# Patient Record
Sex: Female | Born: 1985 | Race: White | Hispanic: No | Marital: Single | Smoking: Current some day smoker
Health system: Southern US, Community
[De-identification: ages and names within clinical notes are randomized; demographics above are authoritative.]

## PROBLEM LIST (undated history)

## (undated) DIAGNOSIS — F319 Bipolar disorder, unspecified: Secondary | ICD-10-CM

## (undated) DIAGNOSIS — F419 Anxiety disorder, unspecified: Secondary | ICD-10-CM

## (undated) HISTORY — DX: Bipolar disorder, unspecified: F31.9

## (undated) HISTORY — PX: WISDOM TOOTH EXTRACTION: SHX21

## (undated) HISTORY — DX: Anxiety disorder, unspecified: F41.9

## (undated) HISTORY — PX: MASTECTOMY: SHX3

---
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
## 2016-06-21 DIAGNOSIS — A749 Chlamydial infection, unspecified: Secondary | ICD-10-CM

## 2016-06-21 DIAGNOSIS — B1920 Unspecified viral hepatitis C without hepatic coma: Secondary | ICD-10-CM

## 2016-06-21 HISTORY — DX: Unspecified viral hepatitis C without hepatic coma: B19.20

## 2016-06-21 HISTORY — DX: Chlamydial infection, unspecified: A74.9

## 2016-12-31 ENCOUNTER — Encounter (HOSPITAL_COMMUNITY): Payer: Self-pay | Admitting: Emergency Medicine

## 2016-12-31 ENCOUNTER — Emergency Department (HOSPITAL_COMMUNITY): Payer: Medicaid Other

## 2016-12-31 ENCOUNTER — Emergency Department (HOSPITAL_COMMUNITY)
Admission: EM | Admit: 2016-12-31 | Discharge: 2016-12-31 | Disposition: A | Discharge status: Home / Self Care | Payer: Medicaid Other | Attending: Emergency Medicine | Admitting: Emergency Medicine

## 2016-12-31 DIAGNOSIS — O0001 Abdominal pregnancy with intrauterine pregnancy: Secondary | ICD-10-CM | POA: Insufficient documentation

## 2016-12-31 DIAGNOSIS — R1084 Generalized abdominal pain: Secondary | ICD-10-CM | POA: Insufficient documentation

## 2016-12-31 DIAGNOSIS — Z3A09 9 weeks gestation of pregnancy: Secondary | ICD-10-CM

## 2016-12-31 DIAGNOSIS — Z3201 Encounter for pregnancy test, result positive: Secondary | ICD-10-CM | POA: Insufficient documentation

## 2016-12-31 DIAGNOSIS — R109 Unspecified abdominal pain: Secondary | ICD-10-CM

## 2016-12-31 DIAGNOSIS — O219 Vomiting of pregnancy, unspecified: Secondary | ICD-10-CM | POA: Insufficient documentation

## 2016-12-31 DIAGNOSIS — F17210 Nicotine dependence, cigarettes, uncomplicated: Secondary | ICD-10-CM | POA: Insufficient documentation

## 2016-12-31 LAB — CBC WITH DIFFERENTIAL/PLATELET
BASOS PCT: 0 %
Basophils Absolute: 0 10*3/uL (ref 0.0–0.1)
EOS ABS: 0.4 10*3/uL (ref 0.0–0.7)
Eosinophils Relative: 4 %
HCT: 36.8 % (ref 36.0–46.0)
HEMOGLOBIN: 13 g/dL (ref 12.0–15.0)
LYMPHS ABS: 2.7 10*3/uL (ref 0.7–4.0)
Lymphocytes Relative: 26 %
MCH: 30.1 pg (ref 26.0–34.0)
MCHC: 35.3 g/dL (ref 30.0–36.0)
MCV: 85.2 fL (ref 78.0–100.0)
Monocytes Absolute: 0.8 10*3/uL (ref 0.1–1.0)
Monocytes Relative: 7 %
NEUTROS ABS: 6.7 10*3/uL (ref 1.7–7.7)
NEUTROS PCT: 63 %
Platelets: 240 10*3/uL (ref 150–400)
RBC: 4.32 MIL/uL (ref 3.87–5.11)
RDW: 13.2 % (ref 11.5–15.5)
WBC: 10.6 10*3/uL — AB (ref 4.0–10.5)

## 2016-12-31 LAB — COMPREHENSIVE METABOLIC PANEL
ALT: 13 U/L — AB (ref 14–54)
ANION GAP: 8 (ref 5–15)
AST: 16 U/L (ref 15–41)
Albumin: 3.6 g/dL (ref 3.5–5.0)
Alkaline Phosphatase: 50 U/L (ref 38–126)
BUN: 7 mg/dL (ref 6–20)
CHLORIDE: 108 mmol/L (ref 101–111)
CO2: 22 mmol/L (ref 22–32)
CREATININE: 0.69 mg/dL (ref 0.44–1.00)
Calcium: 8.9 mg/dL (ref 8.9–10.3)
GFR calc non Af Amer: >60 mL/min (ref >60)
Glucose, Bld: 90 mg/dL (ref 65–99)
Potassium: 3.9 mmol/L (ref 3.5–5.1)
Sodium: 138 mmol/L (ref 135–145)
Total Bilirubin: 0.4 mg/dL (ref 0.3–1.2)
Total Protein: 6.5 g/dL (ref 6.5–8.1)

## 2016-12-31 LAB — ABO/RH: ABO/RH(D): O POS

## 2016-12-31 LAB — HCG, QUANTITATIVE, PREGNANCY: HCG, BETA CHAIN, QUANT, S: 131207 m[IU]/mL — AB (ref <5)

## 2016-12-31 MED ORDER — DOXYLAMINE-PYRIDOXINE 10-10 MG PO TBEC: 1.0000 | DELAYED_RELEASE_TABLET | Freq: Three times a day (TID) | ORAL | 0 refills | Status: DC | PRN

## 2016-12-31 NOTE — ED Provider Notes (Signed)
[facility]-EMERGENCY DEPT Provider Note   CSN: [id number] Arrival date & time: 12/31/16  1423     History   Chief Complaint Chief Complaint  Patient presents with  . Possible Pregnancy  . Emesis During Pregnancy    HPI Elizabeth Taylor is a 31 y.o. female.  Patient is a 31 year old female G2 P1 001 at approximately 3 months gestation. She presents today complaining of nausea and intermittent abdominal cramping for the past 2 days. She denies any vaginal bleeding or discharge. She denies any fevers or chills. She denies any bowel or urinary complaints. Her last menstrual period was April.   The history is provided by the patient.  Abdominal Pain   This is a new problem. The current episode started 2 days ago. Episode frequency: Intermittent. Associated with: Suspected pregnancy. The pain is located in the generalized abdominal region. The quality of the pain is cramping. The pain is moderate. Pertinent negatives include fever, flatus and dysuria. Nothing aggravates the symptoms. Nothing relieves the symptoms.    History reviewed. No pertinent past medical history.  There are no active problems to display for this patient.   History reviewed. No pertinent surgical history.  OB History    Gravida Para Term Preterm AB Living   1             SAB TAB Ectopic Multiple Live Births                   Home Medications    Prior to Admission medications   Medication Sig Start Date End Date Taking? Authorizing Provider  diphenhydrAMINE (BENADRYL) 25 MG tablet Take 25 mg by mouth every 6 (six) hours as needed for allergies.   Yes [provider]  prenatal vitamin w/FE, FA (PRENATAL 1 + 1) 27-1 MG TABS tablet Take 1 tablet by mouth daily at 12 noon.   Yes [provider]    Family History No family history on file.  Social History Social History  Substance Use Topics  . Smoking status: Current Some Day Smoker  . Smokeless tobacco: Never Used  . Alcohol use No       Allergies   Naproxen   Review of Systems Review of Systems  Constitutional: Negative for fever.  Gastrointestinal: Positive for abdominal pain. Negative for flatus.  Genitourinary: Negative for dysuria.  All other systems reviewed and are negative.    Physical Exam Updated Vital Signs BP 126/72 (BP Location: Right Arm)   Pulse 97   Temp 98.5 F (36.9 C) (Oral)   Resp 18   Ht 5\' 4"  (1.626 m)   Wt 61.2 kg (135 lb)   LMP  (LMP Unknown) Comment: "maybe 4-5 months ago"  SpO2 100%   BMI 23.17 kg/m   Physical Exam  Constitutional: She is oriented to person, place, and time. She appears well-developed and well-nourished. No distress.  HENT:  Head: Normocephalic and atraumatic.  Neck: Normal range of motion. Neck supple.  Cardiovascular: Normal rate and regular rhythm.  Exam reveals no gallop and no friction rub.   No murmur heard. Pulmonary/Chest: Effort normal and breath sounds normal. No respiratory distress. She has no wheezes.  Abdominal: Soft. Bowel sounds are normal. She exhibits no distension. There is tenderness. There is no rebound and no guarding.  There is mild tenderness to palpation in the right lower quadrant, left lower quadrant, and suprapubic region.  Musculoskeletal: Normal range of motion.  Neurological: She is alert and oriented to person, place, and  time.  Skin: Skin is warm and dry. She is not diaphoretic.  Nursing note and vitals reviewed.    ED Treatments / Results  Labs (all labs ordered are listed, but only abnormal results are displayed) Labs Reviewed  HCG, QUANTITATIVE, PREGNANCY - Abnormal; Notable for the following:       Result Value   hCG, Beta Chain, Quant, S 131,207 (*)    All other components within normal limits  COMPREHENSIVE METABOLIC PANEL  CBC WITH DIFFERENTIAL/PLATELET  ABO/RH    EKG  EKG Interpretation None       Radiology No results found.  Procedures Procedures (including critical care time)  Medications  Ordered in ED Medications - No data to display   Initial Impression / Assessment and Plan / ED Course  I have reviewed the triage vital signs and the nursing notes.  Pertinent labs & imaging results that were available during my care of the patient were reviewed by me and considered in my medical decision making (see chart for details).  Patient presents with lower abdominal discomfort and last menstrual period of April this year. Her pregnancy test is positive with a quantitative beta of 131,000. Ultrasound shows an intrauterine pregnancy estimated gestational age of [redacted] weeks with a small to moderate subchorionic hemorrhage.  Her blood type is O+.  [facility]. I highly doubt other process such as appendicitis as her history and exam is not consistent with this. She does understand to return if her symptoms worsen or change.  Final Clinical Impressions(s) / ED Diagnoses   Final diagnoses:  Abdominal pain    New Prescriptions New Prescriptions   No medications on file     Geoffery Lyonselo, Berlyn Malina, MD 12/31/16 [phone]

## 2016-12-31 NOTE — Discharge Instructions (Signed)
Tylenol 1000 mg every 8 hours as needed for pain.  Follow[facility]up with [facility] in the next 1-2 weeks. The contact information has been provided in this discharge summary for you to make these arrangements.  Diclegis as prescribed as needed for nausea.  Return to the emergency department if you develop severe abdominal pain, vaginal bleeding, or other new and concerning symptoms.

## 2016-12-31 NOTE — ED Triage Notes (Signed)
Pt reports pregnant, states here for check.  Pt reports ongoing nausea, denies pain or vag bleeding.  NAD noted at this time. Pt states unsure of LMP, "maybe 4-5 months ago."

## 2017-03-17 DIAGNOSIS — R768 Other specified abnormal immunological findings in serum: Secondary | ICD-10-CM | POA: Insufficient documentation

## 2017-06-29 ENCOUNTER — Inpatient Hospital Stay (HOSPITAL_COMMUNITY)
Admission: AD | Admit: 2017-06-29 | Discharge: 2017-06-30 | Disposition: A | Discharge status: Home / Self Care | Payer: Medicaid Other | Source: Ambulatory Visit | Attending: Obstetrics and Gynecology | Admitting: Obstetrics and Gynecology

## 2017-06-29 ENCOUNTER — Encounter (HOSPITAL_COMMUNITY): Payer: Self-pay | Admitting: Emergency Medicine

## 2017-06-29 DIAGNOSIS — F17210 Nicotine dependence, cigarettes, uncomplicated: Secondary | ICD-10-CM | POA: Insufficient documentation

## 2017-06-29 DIAGNOSIS — O99335 Smoking (tobacco) complicating the puerperium: Secondary | ICD-10-CM | POA: Diagnosis not present

## 2017-06-29 LAB — COMPREHENSIVE METABOLIC PANEL
ALK PHOS: 88 U/L (ref 38–126)
ALT: 26 U/L (ref 14–54)
ANION GAP: 7 (ref 5–15)
AST: 30 U/L (ref 15–41)
Albumin: 3 g/dL — ABNORMAL LOW (ref 3.5–5.0)
BUN: 16 mg/dL (ref 6–20)
CALCIUM: 8.7 mg/dL — AB (ref 8.9–10.3)
CO2: 26 mmol/L (ref 22–32)
Chloride: 102 mmol/L (ref 101–111)
Creatinine, Ser: 1.1 mg/dL — ABNORMAL HIGH (ref 0.44–1.00)
GFR calc non Af Amer: >60 mL/min (ref >60)
Glucose, Bld: 148 mg/dL — ABNORMAL HIGH (ref 65–99)
POTASSIUM: 3.1 mmol/L — AB (ref 3.5–5.1)
SODIUM: 135 mmol/L (ref 135–145)
TOTAL PROTEIN: 6.4 g/dL — AB (ref 6.5–8.1)
Total Bilirubin: 0.6 mg/dL (ref 0.3–1.2)

## 2017-06-29 LAB — PREPARE RBC (CROSSMATCH)

## 2017-06-29 LAB — CBC WITH DIFFERENTIAL/PLATELET
BASOS PCT: 0 %
Basophils Absolute: 0 10*3/uL (ref 0.0–0.1)
EOS ABS: 0.2 10*3/uL (ref 0.0–0.7)
EOS PCT: 1 %
HCT: 33.3 % — ABNORMAL LOW (ref 36.0–46.0)
HEMOGLOBIN: 11.5 g/dL — AB (ref 12.0–15.0)
LYMPHS ABS: 5.5 10*3/uL — AB (ref 0.7–4.0)
Lymphocytes Relative: 34 %
MCH: 30.9 pg (ref 26.0–34.0)
MCHC: 34.5 g/dL (ref 30.0–36.0)
MCV: 89.5 fL (ref 78.0–100.0)
MONO ABS: 0.8 10*3/uL (ref 0.1–1.0)
MONOS PCT: 5 %
NEUTROS PCT: 60 %
Neutro Abs: 9.5 10*3/uL — ABNORMAL HIGH (ref 1.7–7.7)
Platelets: 630 10*3/uL — ABNORMAL HIGH (ref 150–400)
RBC: 3.72 MIL/uL — ABNORMAL LOW (ref 3.87–5.11)
RDW: 13.3 % (ref 11.5–15.5)
WBC: 16 10*3/uL — ABNORMAL HIGH (ref 4.0–10.5)

## 2017-06-29 LAB — I-STAT CHEM 8, ED
BUN: 17 mg/dL (ref 6–20)
CHLORIDE: 100 mmol/L — AB (ref 101–111)
Calcium, Ion: 1.15 mmol/L (ref 1.15–1.40)
Creatinine, Ser: 1.1 mg/dL — ABNORMAL HIGH (ref 0.44–1.00)
Glucose, Bld: 142 mg/dL — ABNORMAL HIGH (ref 65–99)
HEMATOCRIT: 33 % — AB (ref 36.0–46.0)
HEMOGLOBIN: 11.2 g/dL — AB (ref 12.0–15.0)
POTASSIUM: 3.4 mmol/L — AB (ref 3.5–5.1)
Sodium: 138 mmol/L (ref 135–145)
TCO2: 26 mmol/L (ref 22–32)

## 2017-06-29 MED ORDER — OXYTOCIN 40 UNITS IN LACTATED RINGERS INFUSION - SIMPLE MED
5.0000 [IU]/h | INTRAVENOUS | Status: DC
Start: 2017-06-29 — End: 2017-06-30
  Administered 2017-06-29: 5 [IU]/h via INTRAVENOUS
  Filled 2017-06-29: qty 1000

## 2017-06-29 MED ORDER — SODIUM CHLORIDE 0.9 % IV BOLUS (SEPSIS)
1000.0000 mL | Freq: Once | INTRAVENOUS | Status: AC
  Administered 2017-06-29: 1000 mL via INTRAVENOUS

## 2017-06-29 MED ORDER — MISOPROSTOL 200 MCG PO TABS
800.0000 ug | ORAL_TABLET | Freq: Once | ORAL | Status: AC
  Administered 2017-06-29: 800 ug via ORAL
  Filled 2017-06-29: qty 4

## 2017-06-29 MED ORDER — SODIUM CHLORIDE 0.9 % IV SOLN
10.0000 mL/h | Freq: Once | INTRAVENOUS | Status: AC
  Administered 2017-06-29: 10 mL/h via INTRAVENOUS

## 2017-06-29 NOTE — ED Provider Notes (Signed)
[facility]   CSN: [id number] Arrival date & time: 06/29/17  2107     History   Chief Complaint Chief Complaint  Patient presents with  . Vaginal Bleeding    HPI Xxxchelsi N Councilman is a 32 y.o. female s/p NSVD, here with vaginal bleeding.  She had a spontaneous vaginal delivery on 12/29.  She actually delivered at home and went [facility] and stayed [facility].  She states that she had minimal bleeding since then and just some spotting as expected.  About 2 hours prior to arrival, patient had sudden onset of vaginal bleeding.  Patient states that she bled out about 10 pads for the last 2 hours.  EMS was called and patient was noted to be hypotensive to 60s and was sent into the ER.  Patient states that she had a low-grade temperature last several days.  Patient states that she definitely did not have any instrumentation since she delivered at home.   The history is provided by the patient.    History reviewed. No pertinent past medical history.  There are no active problems to display for this patient.   History reviewed. No pertinent surgical history.  OB History    Gravida Para Term Preterm AB Living   1             SAB TAB Ectopic Multiple Live Births                   Home Medications    Prior to Admission medications   Medication Sig Start Date End Date Taking? Authorizing Provider  ALPRAZolam Prudy Feeler(XANAX) 1 MG tablet Take 1 mg by mouth as needed for anxiety.   Yes [provider]  amphetamine-dextroamphetamine (ADDERALL XR) 20 MG 24 hr capsule Take 20 mg by mouth as needed.   Yes [provider]  buprenorphine (SUBUTEX) 8 MG SUBL SL tablet Place 8 mg under the tongue 3 (three) times daily.   Yes [provider]  diphenhydrAMINE (BENADRYL) 25 MG tablet Take 25 mg by mouth every 6 (six) hours as needed for allergies.   Yes [provider]  Doxylamine-Pyridoxine 10-10 MG TBEC  Take 1 tablet by mouth every 8 (eight) hours as needed. 12/31/16  Yes Geoffery Lyonselo, Douglas, MD    Family History No family history on file.  Social History Social History   Tobacco Use  . Smoking status: Current Some Day Smoker  . Smokeless tobacco: Never Used  Substance Use Topics  . Alcohol use: No  . Drug use: Yes    Types: Marijuana     Allergies   Naproxen   Review of Systems Review of Systems  Genitourinary: Positive for vaginal bleeding.  All other systems reviewed and are negative.    Physical Exam Updated Vital Signs BP 102/68   Pulse 87   Temp 98.9 F (37.2 C) (Oral)   Resp 15   LMP  (LMP Unknown) Comment: "maybe 4-5 months ago"  SpO2 100%   Breastfeeding? Unknown   Physical Exam  Constitutional: She is oriented to person, place, and time.  Pale, ill appearing   HENT:  Head: Normocephalic.  Mouth/Throat: Oropharynx is clear and moist.  Eyes: Conjunctivae and EOM are normal. Pupils are equal, round, and reactive to light.  Neck: Normal range of motion. Neck supple.  Cardiovascular:  Tachycardic   Pulmonary/Chest: Effort normal and breath sounds normal. No stridor. No respiratory distress. She has no wheezes.  Abdominal:  Soft. Bowel sounds are normal.  Mild lower pelvic tenderness, no rebound   Genitourinary:  Genitourinary Comments: Deferred   Musculoskeletal: Normal range of motion.  Neurological: She is alert and oriented to person, place, and time.  Skin: Skin is warm.  Psychiatric: She has a normal mood and affect.  Nursing note and vitals reviewed.    ED Treatments / Results  Labs (all labs ordered are listed, but only abnormal results are displayed) Labs Reviewed  CBC WITH DIFFERENTIAL/PLATELET - Abnormal; Notable for the following components:      Result Value   WBC 16.0 (*)    RBC 3.72 (*)    Hemoglobin 11.5 (*)    HCT 33.3 (*)    Platelets 630 (*)    Neutro Abs 9.5 (*)    Lymphs Abs 5.5 (*)    All other components within normal  limits  COMPREHENSIVE METABOLIC PANEL - Abnormal; Notable for the following components:   Potassium 3.1 (*)    Glucose, Bld 148 (*)    Creatinine, Ser 1.10 (*)    Calcium 8.7 (*)    Total Protein 6.4 (*)    Albumin 3.0 (*)    All other components within normal limits  I-STAT CHEM 8, ED - Abnormal; Notable for the following components:   Potassium 3.4 (*)    Chloride 100 (*)    Creatinine, Ser 1.10 (*)    Glucose, Bld 142 (*)    Hemoglobin 11.2 (*)    HCT 33.0 (*)    All other components within normal limits  TYPE AND SCREEN  PREPARE RBC (CROSSMATCH)  PREPARE RBC (CROSSMATCH)    EKG  EKG Interpretation None       Radiology No results found.  Procedures Procedures (including critical care time)  CRITICAL CARE Performed by: Richardean Canal   Total critical care time: 40 minutes  Critical care time was exclusive of separately billable procedures and treating other patients.  Critical care was necessary to treat or prevent imminent or life-threatening deterioration.  Critical care was time spent personally by me on the following activities: development of treatment plan with patient and/or surrogate as well as nursing, discussions with consultants, evaluation of patient's response to treatment, examination of patient, obtaining history from patient or surrogate, ordering and performing treatments and interventions, ordering and review of laboratory studies, ordering and review of radiographic studies, pulse oximetry and re-evaluation of patient's condition.   Medications Ordered in ED Medications  oxytocin (PITOCIN) IV infusion 40 units in LR 1000 mL - Premix (5 Units/hr Intravenous New Bag/Given 06/29/17 2237)  sodium chloride 0.9 % bolus 1,000 mL (1,000 mLs Intravenous New Bag/Given 06/29/17 2144)  0.9 %  sodium chloride infusion (10 mL/hr Intravenous New Bag/Given 06/29/17 2146)  0.9 %  sodium chloride infusion (10 mL/hr Intravenous New Bag/Given 06/29/17 2146)  misoprostol  (CYTOTEC) tablet 800 mcg (800 mcg Oral Given 06/29/17 2232)     Initial Impression / Assessment and Plan / ED Course  I have reviewed the triage vital signs and the nursing notes.  Pertinent labs & imaging results that were available during my care of the patient were reviewed by me and considered in my medical decision making (see chart for details).     Xxxchelsi N Hohensee is a 32 y.o. female here with postpartum bleeding. Patient had NSVD 2 weeks ago and had bled 10 pads prior to arrival. Patient hypotensive. Rectal temp nl. I doubt endometritis. Likely uncontrolled postpartum bleeding. I called Dr. Emelda Fear on  arrival. He recommend cytotec 800 mcg and pitocin drip.   11:47 PM Patient had persistent vaginal bleeding. Had 2 L NS bolus. Had 1 U O neg already. Ordered 2nd Unit PRBC. Ordered 3rd bolus, BP up to 90s now. I called Dr. Emelda Fear again. He recommend transfer to [facility] and he will see patient for postpartum bleeding.    Final Clinical Impressions(s) / ED Diagnoses   Final diagnoses:  None    ED Discharge Orders    None       Charlynne Pander, MD 06/29/17 2349

## 2017-06-29 NOTE — ED Notes (Signed)
Pt states she was spotting on and off since her delivery but had no spotting yesterday and then today around 5pm is when the large clots started

## 2017-06-29 NOTE — ED Triage Notes (Signed)
Pt to ED with c/o heavy vag. Bleeding.  Pt is approx 2 weeks post partum.  Pt st's started having sudden onset of heavy vag bleeding and passing clots.  On arrival to triage pt very pale and diaphoretic.  Charge nurse notified and pt moved to d-30.

## 2017-06-30 ENCOUNTER — Inpatient Hospital Stay (HOSPITAL_COMMUNITY): Payer: Medicaid Other

## 2017-06-30 ENCOUNTER — Encounter (HOSPITAL_COMMUNITY): Payer: Self-pay | Admitting: *Deleted

## 2017-06-30 LAB — BPAM RBC
BLOOD PRODUCT EXPIRATION DATE: 201901192359
BLOOD PRODUCT EXPIRATION DATE: 201901222359
BLOOD PRODUCT EXPIRATION DATE: 201902032359
Blood Product Expiration Date: 201901192359
Blood Product Expiration Date: 201901192359
Blood Product Expiration Date: 201901192359
ISSUE DATE / TIME: 201901092142
ISSUE DATE / TIME: 201901092318
UNIT TYPE AND RH: 5100
UNIT TYPE AND RH: 5100
UNIT TYPE AND RH: 5100
UNIT TYPE AND RH: 5100
Unit Type and Rh: 5100
Unit Type and Rh: 9500

## 2017-06-30 LAB — TYPE AND SCREEN
ABO/RH(D): O POS
ANTIBODY SCREEN: NEGATIVE
UNIT DIVISION: 0
UNIT DIVISION: 0
Unit division: 0
Unit division: 0
Unit division: 0
Unit division: 0

## 2017-06-30 LAB — CBC
HCT: 28.8 % — ABNORMAL LOW (ref 36.0–46.0)
Hemoglobin: 10.3 g/dL — ABNORMAL LOW (ref 12.0–15.0)
MCH: 31.4 pg (ref 26.0–34.0)
MCHC: 35.8 g/dL (ref 30.0–36.0)
MCV: 87.8 fL (ref 78.0–100.0)
PLATELETS: 283 10*3/uL (ref 150–400)
RBC: 3.28 MIL/uL — ABNORMAL LOW (ref 3.87–5.11)
RDW: 14.3 % (ref 11.5–15.5)
WBC: 12.6 10*3/uL — ABNORMAL HIGH (ref 4.0–10.5)

## 2017-06-30 MED ORDER — IBUPROFEN 600 MG PO TABS: 600.0000 mg | ORAL_TABLET | Freq: Four times a day (QID) | ORAL | 0 refills | Status: DC | PRN

## 2017-06-30 MED ORDER — FERROUS SULFATE 325 (65 FE) MG PO TABS: 325.0000 mg | ORAL_TABLET | Freq: Every day | ORAL | 3 refills | Status: DC

## 2017-06-30 MED ORDER — METHYLERGONOVINE MALEATE 0.2 MG PO TABS: 0.2000 mg | ORAL_TABLET | Freq: Three times a day (TID) | ORAL | 0 refills | Status: DC

## 2017-06-30 MED ORDER — IBUPROFEN 600 MG PO TABS
600.0000 mg | ORAL_TABLET | Freq: Four times a day (QID) | ORAL | Status: DC | PRN
  Administered 2017-06-30: 600 mg via ORAL
  Filled 2017-06-30: qty 1

## 2017-06-30 NOTE — ED Notes (Signed)
[facility] contacted for tx to [facility], Dr. Algie CofferFogelman receiving

## 2017-06-30 NOTE — Discharge Instructions (Signed)
Postpartum Hemorrhage °Postpartum hemorrhage is excessive blood loss after childbirth. Vaginal bleeding after delivery is normal and should be expected. Bleeding (lochia) will occur for several days after childbirth. This can be expected with normal vaginal deliveries and cesarean deliveries. However, postpartum hemorrhage is a potentially serious condition. °What are the causes? °This condition is caused by: °· A loss of muscle tone in the uterus after childbirth. This can be caused by: °? An abnormal placenta. °? Infection. °? Bladder swelling (distension). °· Failure to deliver all of the placenta or the retention of clots. °· Wounds in the birth canal caused by delivery of the fetus. °· Infection of the uterus. °· Infection of tissue around the fetus. °· A tear in the uterus. °· Tearing of the vagina or cervix during delivery. °· A maternal bleeding disorder that prevents blood from clotting (rare). ° °What increases the risk? °This condition is likely to develop in people who: °· Have a history of postpartum hemorrhage. °· Had a delivery that lasted longer than usual. °· Have an excess of amniotic fluid in the amniotic sac (polyhydramnios), leading the uterus to stretch too much. °· Have delivered quintuplets or more babies. °· Had high blood pressure, seizures, or coma during pregnancy. °· Had a condition called preeclampsia or eclampsia during pregnancy. °· Had problems with the placenta. °· Had complications during labor or delivery. °· Are obese. °· Are 40 years old or older. °· Are Asian or Hispanic. ° °What are the signs or symptoms? °Symptoms of this condition include: °· Passing large clots or pieces of tissue. These may be small pieces of placenta left after delivery. °· Soaking more than one sanitary pad per hour for several hours. °· Heavy, bright-red bleeding that occurs 4 days or more after delivery. °· Discharge that has a bad smell. °· An unexplained fever. °· Nausea or vomiting. °· Pain or  swelling near the vagina or perineum. °· A drop in blood pressure. °· Lightheadedness or fainting. °· Shortness of breath. °· A fast heart rate that happens with very little activity. °· Signs of shock, such as: °? Blurry vision. °? Chills. °? Dizziness. °? Weakness. ° °How is this diagnosed? °This condition may be diagnosed based on: °· Your symptoms. °· A physical exam of your perineum, vagina, cervix, and uterus. °· Tests, including: °? Blood pressure and pulse measurements. °? Blood tests. °? Blood clotting tests. °? Ultrasonography. ° °How is this treated? °Treatment for this condition depends on the severity of your symptoms. It may include: °· Uterine massage. °· Medicines to help the uterus contract. °· Blood transfusions. °· Fluids given through the vein. °· A medical procedure to compress arteries supplying the uterus. ° °Sometimes bleeding occurs if portions of the placenta are left behind in the uterus after delivery. If this happens, a curettage or scraping of the inside of the uterus must be done (rare). This usually stops the bleeding. If curettage does not stop the bleeding, surgery may be done to remove the uterus (hysterectomy), but this rarely occurs. °If bleeding is due to clotting or bleeding problems that are not related to the pregnancy, other treatments may be needed. °Follow these instructions at home: °· Limit your activity as directed by your health care provider. Your health care provider may order bed rest (getting up to go to the bathroom only) or may allow you to continue light activity. °· Keep track of the number of pads you use each day and how soaked (saturated) they are. Write down   this information. °· Do not use tampons. °· Do not douche or have sexual intercourse until your health care provider approves. °· Drink enough fluids to keep your urine clear or pale yellow. °· Get enough rest. °· Eat foods that are rich in iron, such as spinach, red meat, and legumes. °· Take any  over-the-counter and prescription medicines only as told by your health care provider. °· Keep all follow-up visits as told by your health care provider. This is important. °Get help right away if: °· You experience severe cramps in your stomach, back, or abdomen. °· You have a fever. °· You pass large clots or tissue. Save any tissue for your health care provider to look at. °· Your bleeding increases. °· You have heavy bleeding that soaks one pad per hour for 2 hours in a row. °· You faint or become dizzy, weak, or lightheaded. °· Your sanitary pad count per hour is increasing. °· You are urinating less than usual or not at all. °· You have shortness of breath. °· Your heart rate is faster than usual. °· You have sudden chest pain. °This information is not intended to replace advice given to you by your health care provider. Make sure you discuss any questions you have with your health care provider. °Document Released: 08/28/2003 Document Revised: 02/04/2016 Document Reviewed: 01/09/2016 °[employer] Interactive Patient Education © 2018 [employer] ° °

## 2017-06-30 NOTE — MAU Provider Note (Signed)
Chief Complaint:  Vaginal Bleeding   First Provider Initiated Contact with Patient 06/30/17 0325       HPI: Elizabeth Taylor is a 32 y.o. [id number] who presents to maternity admissions reporting heavy vaginal bleeding since yesterday.  Delivered precipitously at home on 12/29.  Did well until yesterday. Was hypotensive and taken to ED.  Was transfused and given IV fluid boluses.  Bleeding has lessened now. . She reports vaginal bleeding, but no vaginal itching/burning, urinary symptoms, h/a, dizziness, n/v, or fever/chills.    Vaginal Bleeding  The patient's primary symptoms include vaginal bleeding. The patient's pertinent negatives include no genital itching, genital lesions or genital odor. This is a new problem. The current episode started yesterday. The problem occurs constantly. The problem has been gradually improving. The pain is mild. She is not pregnant. Pertinent negatives include no back pain, chills, constipation, diarrhea, fever, nausea or vomiting. The vaginal discharge was bloody. The vaginal bleeding is heavier than menses. She has been passing clots. Nothing aggravates the symptoms. She has tried nothing for the symptoms.   RN Note: PT HAS ARRIVED VIA [facility]  FROM [facility]    PT SAYS SHE DEL 2 WEEKS AGO-  AT HOME-  THEN WENT  TO [facility].   NOW  BABY IS IN REG NURSERY  AT [facility]. NO ACTIVE BLEEDING ON ARRIVAL- BLOOD ON PERINEUM  ED NOTE: Elizabeth Taylor is a 32 y.o. female s/p NSVD, here with vaginal bleeding.  She had a spontaneous vaginal delivery on 12/29.  She actually delivered at home and went to [facility] and stayed overnight.  She states that she had minimal bleeding since then and just some spotting as expected.  About 2 hours prior to arrival, patient had sudden onset of vaginal bleeding.  Patient states that she bled out about 10 pads for the last 2 hours.  EMS was called and patient was noted to be hypotensive to 60s and was sent into the ER.  Patient states  that she had a low-grade temperature last several days.  Patient states that she definitely did not have any instrumentation since she delivered at home.      Past Medical History: History reviewed. No pertinent past medical history.  Past obstetric history: OB History  Gravida Para Term Preterm AB Living  5       2 3   SAB TAB Ectopic Multiple Live Births  2       3    # Outcome Date GA Lbr Len/2nd Weight Sex Delivery Anes PTL Lv  5 Gravida      Vag-Spont     4 Gravida      Vag-Spont     3 Gravida      Vag-Spont     2 SAB           1 SAB               Past Surgical History: Past Surgical History:  Procedure Laterality Date  . WISDOM TOOTH EXTRACTION      Family History: History reviewed. No pertinent family history.  Social History: Social History   Tobacco Use  . Smoking status: Current Some Day Smoker  . Smokeless tobacco: Never Used  Substance Use Topics  . Alcohol use: No  . Drug use: Yes    Types: Marijuana    Comment: NO DRUGS DURING  PREG    Allergies:  Allergies  Allergen Reactions  . Naproxen Hives    Aleve  Meds:  Medications Prior to Admission  Medication Sig Dispense Refill Last Dose  . ALPRAZolam (XANAX) 1 MG tablet Take 1 mg by mouth as needed for anxiety.   06/29/2017  . amphetamine-dextroamphetamine (ADDERALL XR) 20 MG 24 hr capsule Take 20 mg by mouth as needed.   06/28/2017 at Unknown time  . buprenorphine (SUBUTEX) 8 MG SUBL SL tablet Place 8 mg under the tongue 3 (three) times daily.   06/29/2017 at Unknown time  . diphenhydrAMINE (BENADRYL) 25 MG tablet Take 25 mg by mouth every 6 (six) hours as needed for allergies.   prn  . Doxylamine-Pyridoxine 10-10 MG TBEC Take 1 tablet by mouth every 8 (eight) hours as needed. 15 tablet 0 prn    I have reviewed patient's Past Medical Hx, Surgical Hx, Family Hx, Social Hx, medications and allergies.  ROS:  Review of Systems  Constitutional: Negative for chills and fever.  Gastrointestinal:  Negative for constipation, diarrhea, nausea and vomiting.  Genitourinary: Positive for vaginal bleeding.  Musculoskeletal: Negative for back pain.   Other systems negative     Physical Exam   Patient Vitals for the past 24 hrs:  BP Temp Temp src Pulse Resp SpO2  06/30/17 0310 119/70 98.7 F (37.1 C) Oral 74 20 -  06/30/17 0244 - 98.7 F (37.1 C) - - - -  06/30/17 0240 106/80 - - 91 16 100 %  06/30/17 0230 103/72 - - 76 15 98 %  06/30/17 0220 108/70 - - 73 15 99 %  06/30/17 0210 102/74 - - 96 17 97 %  06/30/17 0200 106/73 - - 87 17 99 %  06/30/17 0130 103/66 - - 81 17 97 %  06/30/17 0100 100/65 - - 80 17 97 %  06/30/17 0050 101/68 - - 85 17 97 %  06/30/17 0045 - 98.9 F (37.2 C) - 83 17 97 %  06/30/17 0040 100/66 - - 73 16 98 %  06/30/17 0030 101/64 - - 81 18 97 %  06/30/17 0020 102/67 - - 72 18 99 %  06/30/17 0000 97/62 - - 79 17 100 %  06/29/17 2340 94/61 - - 85 16 100 %  06/29/17 2334 102/68 98.9 F (37.2 C) Oral 87 15 -  06/29/17 2330 102/68 - - 87 16 99 %  06/29/17 2310 (!) 97/59 - - 85 17 100 %  06/29/17 2300 (!) 92/57 - - 81 16 100 %  06/29/17 2250 100/61 - - 82 13 100 %  06/29/17 2240 97/68 - - 91 13 100 %  06/29/17 2230 92/63 - - 83 15 100 %  06/29/17 2207 (!) 89/55 (!) 97.5 F (36.4 C) Oral 87 17 99 %  06/29/17 2149 (!) 89/51 99 F (37.2 C) Rectal 87 11 97 %  06/29/17 2145 - 99 F (37.2 C) Rectal - - -  06/29/17 2145 (!) 89/51 - - (!) 105 15 100 %  06/29/17 2142 (!) 77/36 - - - - -  06/29/17 2132 (!) 59/43 (!) 97.3 F (36.3 C) Oral 85 - 100 %   Constitutional: Well-developed, well-nourished female in no acute distress.  Cardiovascular: normal rate and rhythm, no ectopy audible, S1 & S2 heard, no murmur Respiratory: normal effort, no distress. Lungs CTAB with no wheezes or crackles GI: Abd soft, non-tender.  Nondistended.  No rebound, No guarding.  Bowel Sounds audible  MS: Extremities nontender, no edema, normal ROM Neurologic: Alert and oriented x 4.    Grossly nonfocal. GU: Neg CVAT. Skin:  Warm and Dry Psych:  Affect appropriate.  Dr Emelda Fear present for pelvic exam  PELVIC EXAM: Cervix pink, visually closed, without lesion, large amount of clots in vault, removed.  Bimanual exam: Cervix firm, anterior, neg CMT, uterus nontender, enlarged to 8 wk size, adnexa without tenderness, enlargement, or mass    Labs: Results for orders placed or performed during the hospital encounter of 06/29/17 (from the past 24 hour(s))  CBC with Differential/Platelet     Status: Abnormal   Collection Time: 06/29/17  9:39 PM  Result Value Ref Range   WBC 16.0 (H) 4.0 - 10.5 K/uL   RBC 3.72 (L) 3.87 - 5.11 MIL/uL   Hemoglobin 11.5 (L) 12.0 - 15.0 g/dL   HCT 16.1 (L) 09.6 - 04.5 %   MCV 89.5 78.0 - 100.0 fL   MCH 30.9 26.0 - 34.0 pg   MCHC 34.5 30.0 - 36.0 g/dL   RDW 40.9 81.1 - 91.4 %   Platelets 630 (H) 150 - 400 K/uL   Neutrophils Relative % 60 %   Neutro Abs 9.5 (H) 1.7 - 7.7 K/uL   Lymphocytes Relative 34 %   Lymphs Abs 5.5 (H) 0.7 - 4.0 K/uL   Monocytes Relative 5 %   Monocytes Absolute 0.8 0.1 - 1.0 K/uL   Eosinophils Relative 1 %   Eosinophils Absolute 0.2 0.0 - 0.7 K/uL   Basophils Relative 0 %   Basophils Absolute 0.0 0.0 - 0.1 K/uL  Comprehensive metabolic panel     Status: Abnormal   Collection Time: 06/29/17  9:39 PM  Result Value Ref Range   Sodium 135 135 - 145 mmol/L   Potassium 3.1 (L) 3.5 - 5.1 mmol/L   Chloride 102 101 - 111 mmol/L   CO2 26 22 - 32 mmol/L   Glucose, Bld 148 (H) 65 - 99 mg/dL   BUN 16 6 - 20 mg/dL   Creatinine, Ser 7.82 (H) 0.44 - 1.00 mg/dL   Calcium 8.7 (L) 8.9 - 10.3 mg/dL   Total Protein 6.4 (L) 6.5 - 8.1 g/dL   Albumin 3.0 (L) 3.5 - 5.0 g/dL   AST 30 15 - 41 U/L   ALT 26 14 - 54 U/L   Alkaline Phosphatase 88 38 - 126 U/L   Total Bilirubin 0.6 0.3 - 1.2 mg/dL   GFR calc non Af Amer >60 >60 mL/min   GFR calc Af Amer >60 >60 mL/min   Anion gap 7 5 - 15  Prepare RBC     Status: None   Collection  Time: 06/29/17  9:39 PM  Result Value Ref Range   Order Confirmation ORDER PROCESSED BY BLOOD BANK   Prepare RBC     Status: None   Collection Time: 06/29/17  9:39 PM  Result Value Ref Range   Order Confirmation ORDER PROCESSED BY BLOOD BANK   Type and screen Ordered by PROVIDER DEFAULT     Status: None (Preliminary result)   Collection Time: 06/29/17  9:39 PM  Result Value Ref Range   ABO/RH(D) O POS    Antibody Screen NEG    Sample Expiration 07/02/2017    Unit Number [id number]    Blood Component Type RBC LR PHER2    Unit division 00    Status of Unit ISSUED    Transfusion Status OK TO TRANSFUSE    Crossmatch Result COMPATIBLE    Unit Number [id number]    Blood Component Type RED CELLS,LR    Unit division 00    Status  of Unit ISSUED    Transfusion Status OK TO TRANSFUSE    Crossmatch Result Compatible    Unit Number [id number]    Blood Component Type RED CELLS,LR    Unit division 00    Status of Unit ALLOCATED    Transfusion Status OK TO TRANSFUSE    Crossmatch Result Compatible    Unit Number [id number]    Blood Component Type RED CELLS,LR    Unit division 00    Status of Unit ALLOCATED    Transfusion Status OK TO TRANSFUSE    Crossmatch Result Compatible    Unit Number [id number]    Blood Component Type RBC LR PHER2    Unit division 00    Status of Unit ALLOCATED    Transfusion Status OK TO TRANSFUSE    Crossmatch Result Compatible    Unit Number [id number]    Blood Component Type RED CELLS,LR    Unit division 00    Status of Unit ALLOCATED    Transfusion Status OK TO TRANSFUSE    Crossmatch Result Compatible   I-stat chem 8, ed     Status: Abnormal   Collection Time: 06/29/17  9:58 PM  Result Value Ref Range   Sodium 138 135 - 145 mmol/L   Potassium 3.4 (L) 3.5 - 5.1 mmol/L   Chloride 100 (L) 101 - 111 mmol/L   BUN 17 6 - 20 mg/dL   Creatinine, Ser 7.25 (H) 0.44 - 1.00 mg/dL   Glucose, Bld 366 (H) 65 - 99 mg/dL   Calcium, Ion 4.40 3.47  - 1.40 mmol/L   TCO2 26 22 - 32 mmol/L   Hemoglobin 11.2 (L) 12.0 - 15.0 g/dL   HCT 42.5 (L) 95.6 - 38.7 %  CBC     Status: Abnormal   Collection Time: 06/30/17  5:18 AM  Result Value Ref Range   WBC 12.6 (H) 4.0 - 10.5 K/uL   RBC 3.28 (L) 3.87 - 5.11 MIL/uL   Hemoglobin 10.3 (L) 12.0 - 15.0 g/dL   HCT 56.4 (L) 33.2 - 95.1 %   MCV 87.8 78.0 - 100.0 fL   MCH 31.4 26.0 - 34.0 pg   MCHC 35.8 30.0 - 36.0 g/dL   RDW 88.4 16.6 - 06.3 %   Platelets 283 150 - 400 K/uL    Imaging:  US Pelvic Complete With Transvaginal  Result Date: 06/30/2017 CLINICAL DATA:  32 year old female with vaginal delivery on 06/18/2017 presents with low-grade temperature. EXAM: TRANSABDOMINAL AND TRANSVAGINAL ULTRASOUND OF PELVIS TECHNIQUE: Both transabdominal and transvaginal ultrasound examinations of the pelvis were performed. Transabdominal technique was performed for global imaging of the pelvis including uterus, ovaries, adnexal regions, and pelvic cul-de-sac. It was necessary to proceed with endovaginal exam following the transabdominal exam to visualize the endometrium and the ovaries. COMPARISON:  Obstetrical ultrasound dated 06/08/2017 FINDINGS: Uterus Measurements: 10.7 x 6.9 x 8.9 cm. The uterus is anteverted and appears heterogeneous. There are scattered small cystic spaces or prominent myometrial vessels. There is increased vascularity and flow in the myometrium. Endometrium Thickness: The endometrium is poorly visualized and suboptimally evaluated. Right ovary Measurements: 4.2 x 2.1 x 1.9 cm. Normal appearance/no adnexal mass. Left ovary Measurements: 4.1 x 1.8 x 2.0 cm. Normal appearance/no adnexal mass. Other findings Small free fluid within the pelvis. IMPRESSION: 1. Heterogeneous and hypervascular myometrium, likely related to postpartum status. 2. Suboptimal evaluation of the endometrium. 3. Unremarkable ovaries. Electronically Signed   By: Elgie Collard M.D.   On: 06/30/2017 04:48  MAU  Course/MDM: I have ordered labs as follows: repeat CBC Imaging ordered: US Results reviewed. US showed nonspecific findings with endometrium.  Consult Dr Emelda FearFerguson.   Treatments in MAU included IV fluids, ibuprofen.   Pt stable at time of discharge.  Assessment: 1. Postpartum hemorrhage, unspecified type   2. Postpartum hemorrhage, delayed (> 24 hrs), postpartum condition   3.      Anemia  Plan: Discharge home Recommend bleeding precautions Rx sent for methergine for bleeding x 3 days rx Ferrous sulfate  Call [facility] in am for followup appointment   Encouraged to return here or to other Urgent Care/ED if she develops worsening of symptoms, increase in pain, fever, or other concerning symptoms.   Wynelle BourgeoisMarie Ursala Cressy CNM, MSN Certified Nurse-Midwife 06/30/2017 3:31 AM

## 2017-06-30 NOTE — MAU Note (Signed)
PO SPRITE AND WATER- HOB UP

## 2017-06-30 NOTE — MAU Note (Addendum)
PT HAS ARRIVED VIA [facility]  FROM [facility]    PT SAYS SHE DEL 2 WEEKS AGO-  AT HOME-  THEN WENT  TO [facility].   NOW  BABY IS IN REG NURSERY  AT [facility]. NO ACTIVE BLEEDING ON ARRIVAL- BLOOD ON PERINEUM

## 2017-08-22 ENCOUNTER — Encounter: Payer: Medicaid Other | Admitting: Obstetrics & Gynecology

## 2018-01-18 IMAGING — US US OB COMP LESS 14 WK
1 series · 14 of 28 positions shown · non-contrast
Comparison: None.

CLINICAL DATA: Pregnant patient in first-trimester pregnancy with
abdominal pain for 1 week.

EXAM:
OBSTETRIC <14 WK US AND TRANSVAGINAL OB US
TECHNIQUE: Both transabdominal and transvaginal ultrasound examinations were
performed for complete evaluation of the gestation as well as the
maternal uterus, adnexal regions, and pelvic cul-de-sac.
Transvaginal technique was performed to assess early pregnancy.

[Series 1: us ob comp less 14 wk · 0.23mm/px · 14 of 53 slices shown]
[im 2/53]
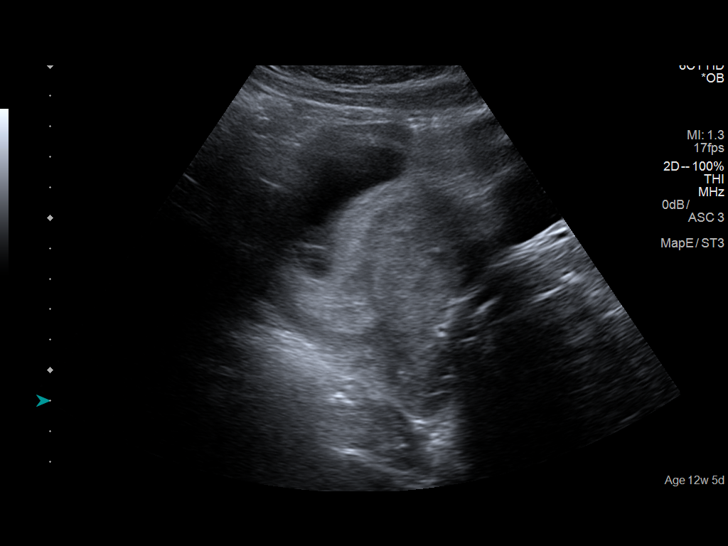
[im 6/53]
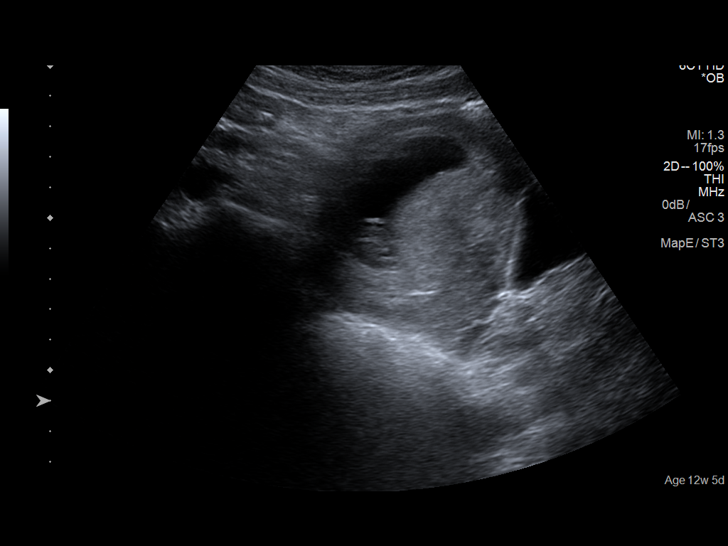
[im 10/53]
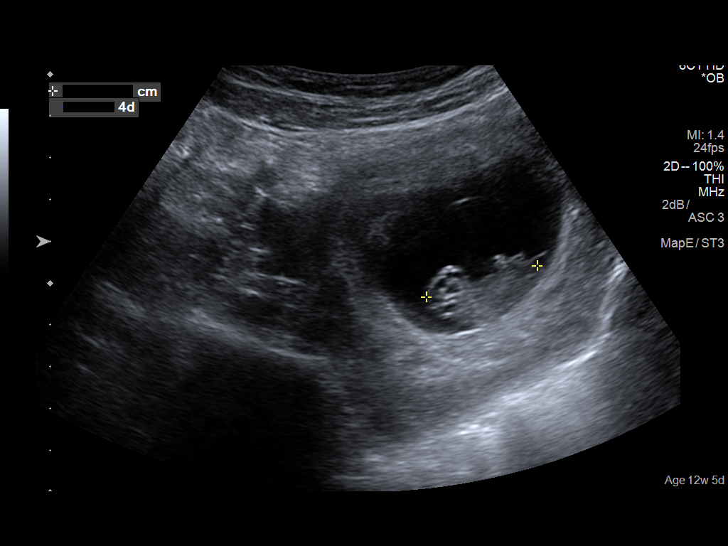
[im 14/53]
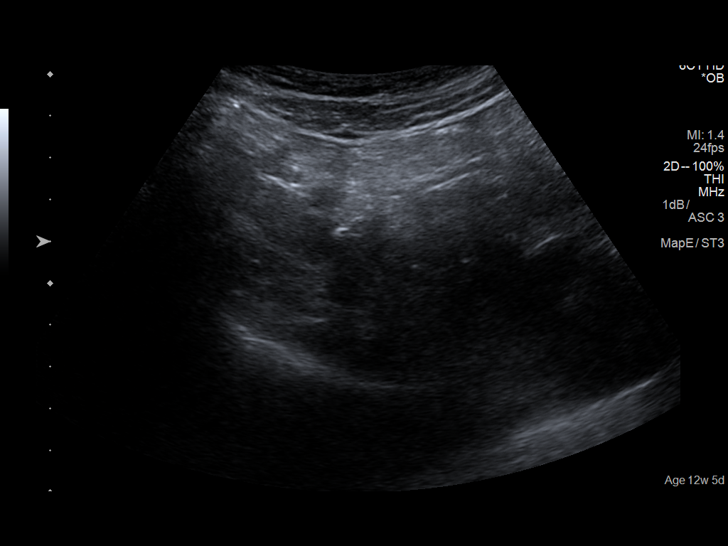
[im 18/53]
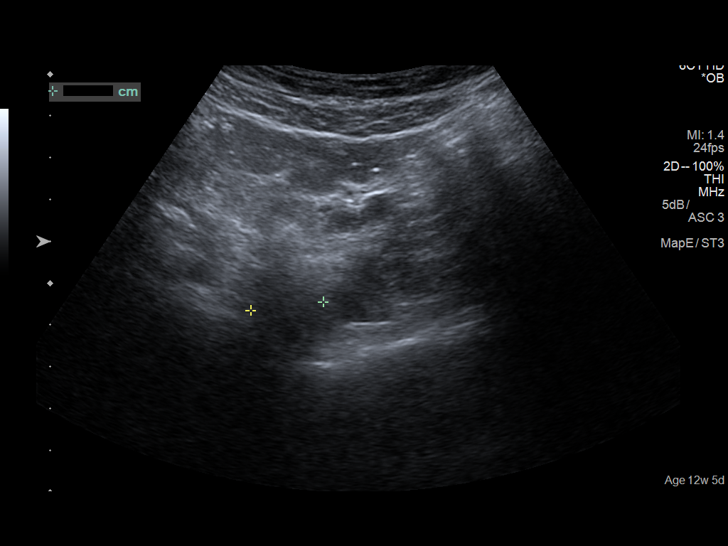
[im 22/53]
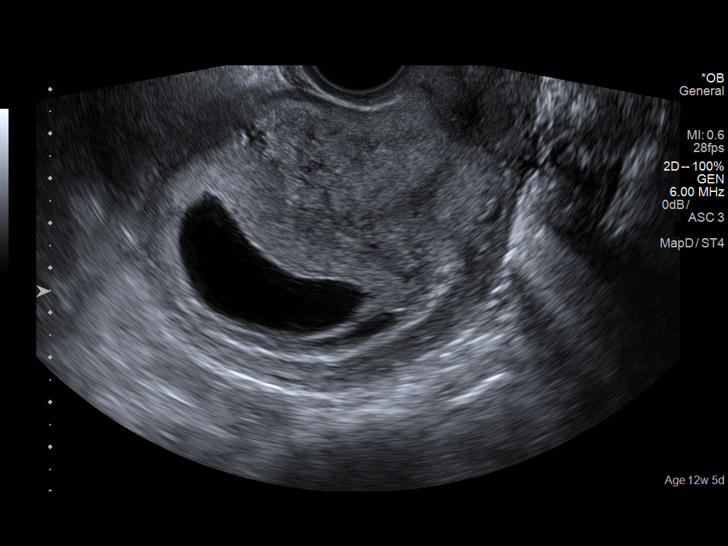
[im 26/53]
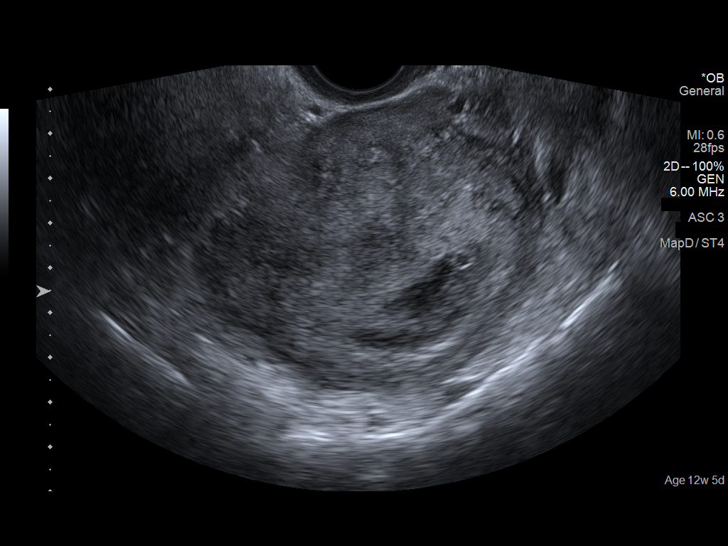
[im 29/53]
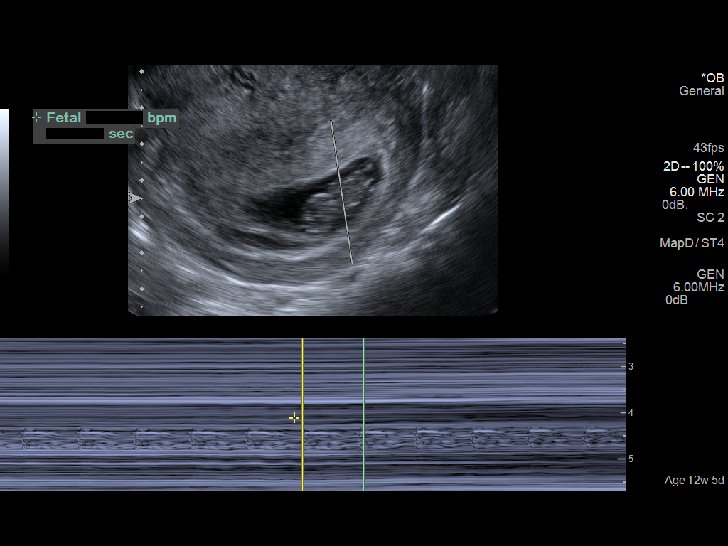
[im 33/53]
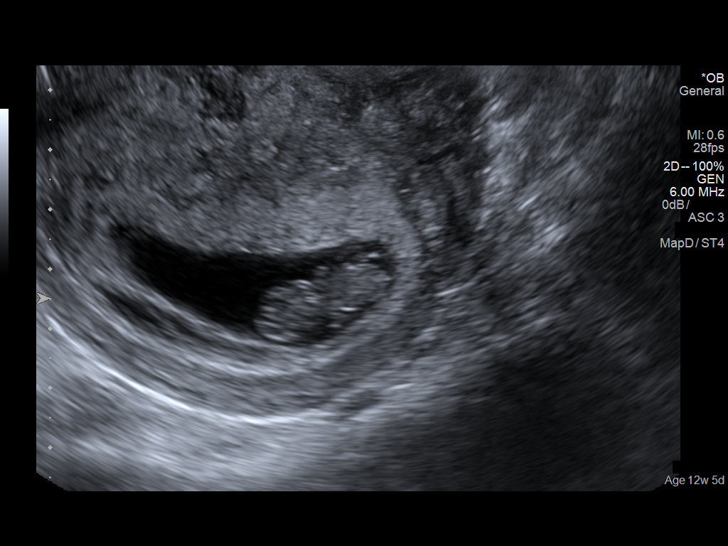
[im 37/53]
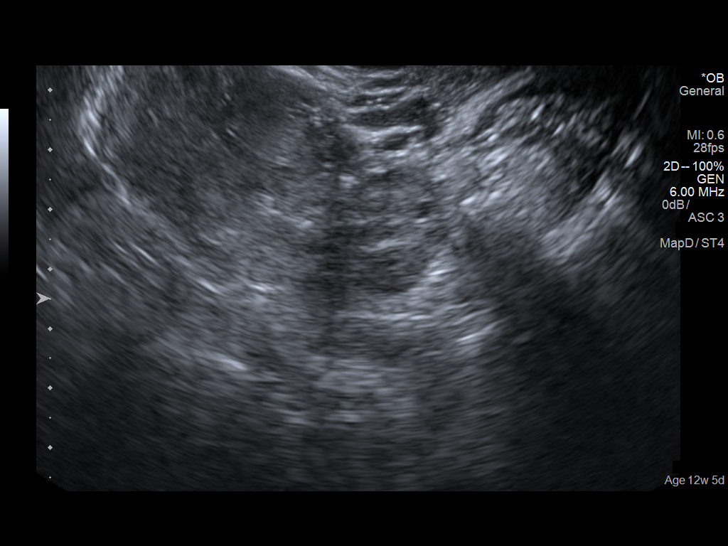
[im 41/53]
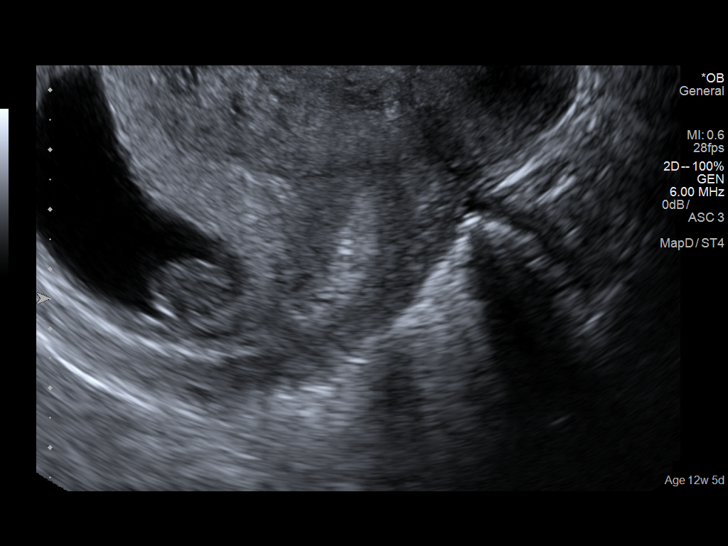
[im 45/53]
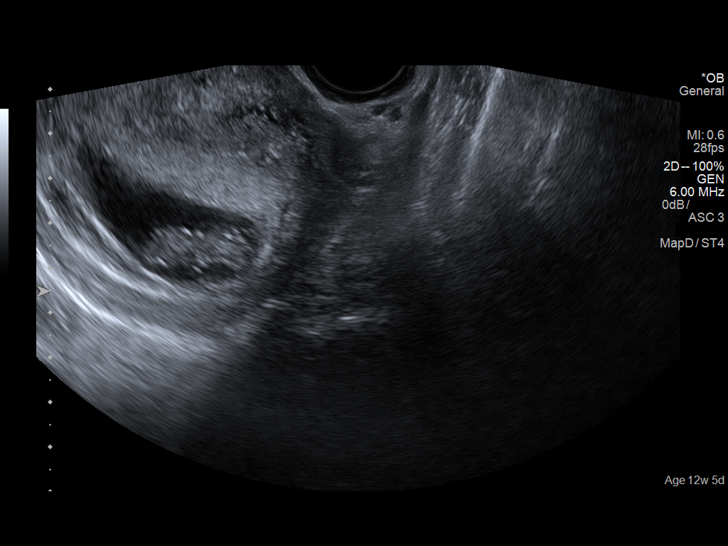
[im 49/53]
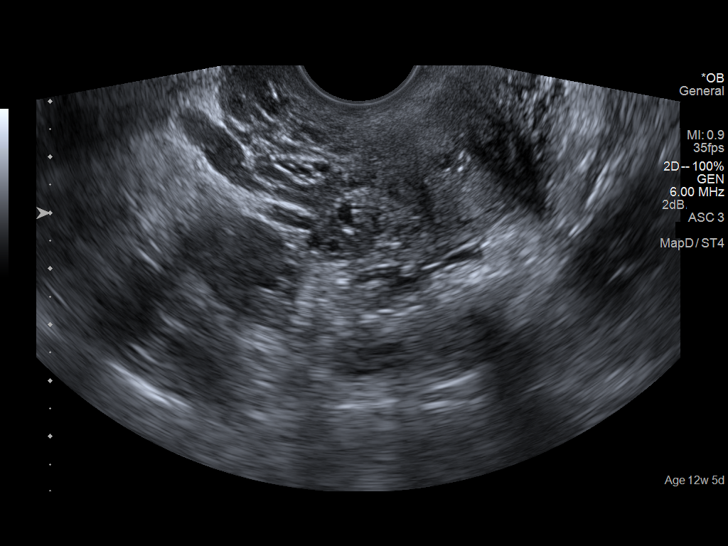
[im 53/53]
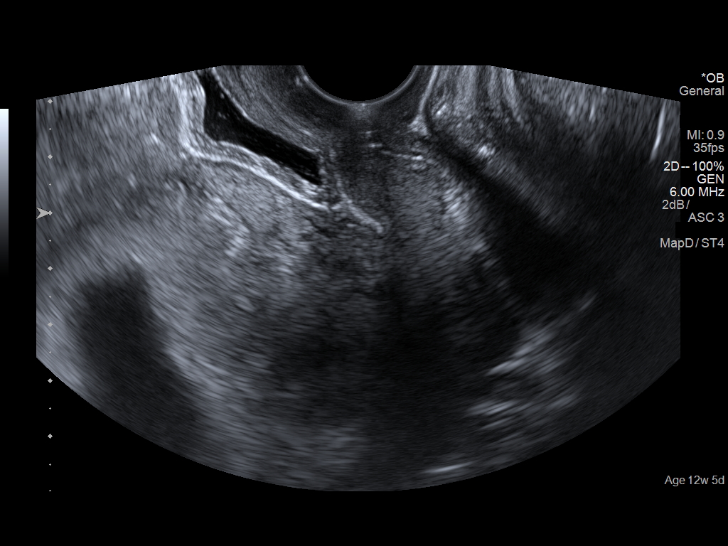

[14 of 28 positions shown; findings below may reference images not displayed]

FINDINGS: Intrauterine gestational sac: Single.

Yolk sac:  Visualized.

Embryo:  Visualized.

Cardiac Activity: Visualized.

Heart Rate: 168  bpm

CRL:  24.6  mm   9 w   1 d                  US EDC: 08/04/2017

Subchorionic hemorrhage: Small to moderate, elongated measuring
x 0.6 cm.

Maternal uterus/adnexae: Both ovaries are visualized and normal. No
adnexal mass. There is no pelvic free fluid.
IMPRESSION: 1. Single live intrauterine pregnancy estimated gestational age 9
weeks 1 day for estimated date of delivery 08/04/2017.
2. Small to moderate subchorionic hemorrhage.

## 2019-06-22 NOTE — L&D Delivery Note (Signed)
OB/GYN Faculty Practice Delivery Note  Elizabeth Taylor is a 34 y.o. [id number] s/p NSVD at [redacted]w[redacted]d. She was admitted for SOL.   ROM: 0h 35m with clear fluid GBS Status: Unknown Maximum Maternal Temperature: 98.9  Labor Progress: . Pt arrived to MAU in latent labor and progressed to active. She was 8cm upon arrival to MAU and quickly became complete with the urge to push.  Delivery Date/Time: 03/06/20 at 1537 Delivery: Micah Flesher to room after prolonged deceleration with good recovery and variability. Pt was waiting for an epidural but agreed to AROM and push with the urge. Head delivered ROA. Tight nuchal cord present x3 plus a cord around the body and a compound hand. Shoulder and body delivered after a 50s dystocia resolved with McRoberts and axillary hook and woods screw maneuver. No spontaneous cry at delivery. Code APGAR called, I cut and clamped the cord so baby could be taken to the warmer for resuscitation. Cord blood drawn. Placenta delivered spontaneously, intact, with 3-vessel cord. Fundus firm with massage and Pitocin. Labia, perineum, vagina, and cervix inspected, no laceration found.  Placenta: Intact Complications: Dystocia and Code APGAR Lacerations: None EBL: 100 Analgesia: None  Postpartum Planning [x]  transfer orders to MB [x]  discharge summary started & shared [x]  message to sent to schedule follow-up  [x]  lists updated [x]  vaccines UTD  Infant: Girl  APGARs 1/6  Pending  , CNM, IBCLC Certified Nurse Midwife, [facility] for , [facility] 03/06/2020, 4:01 PM

## 2019-07-15 IMAGING — US US PELVIS COMPLETE TRANSABD/TRANSVAG
1 series · 15 of 25 positions shown · non-contrast
Comparison: Obstetrical ultrasound dated 06/08/2017

CLINICAL DATA: 31-year-old female with vaginal delivery on
06/18/2017 presents with low-grade temperature.

EXAM:
TRANSABDOMINAL AND TRANSVAGINAL ULTRASOUND OF PELVIS
TECHNIQUE: Both transabdominal and transvaginal ultrasound examinations of the
pelvis were performed. Transabdominal technique was performed for
global imaging of the pelvis including uterus, ovaries, adnexal
regions, and pelvic cul-de-sac. It was necessary to proceed with
endovaginal exam following the transabdominal exam to visualize the
endometrium and the ovaries.

[Series 1: us pelvis complete transabd/transvag · 40 acquisitions, 15 frames shown]
[im 1/40]
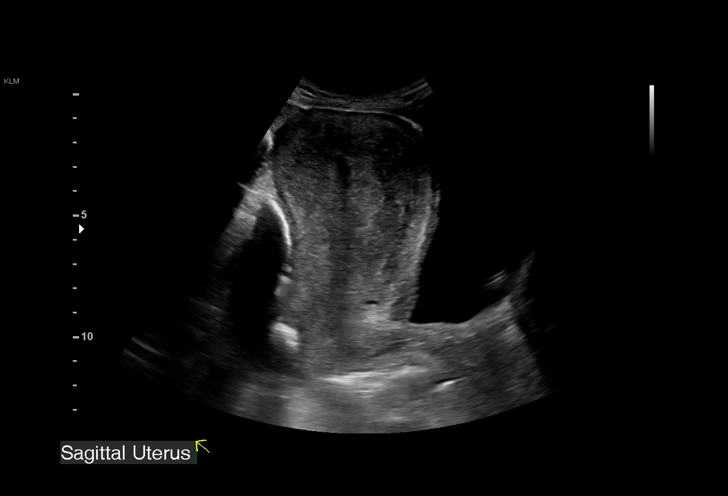
[im 4/40]
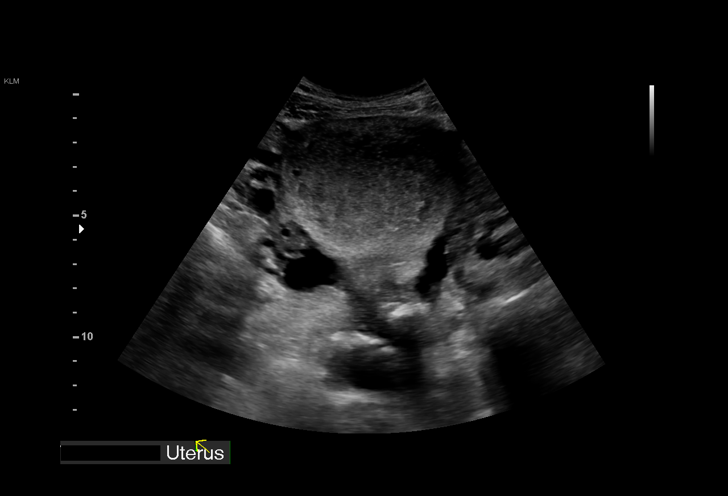
[im 7/40]
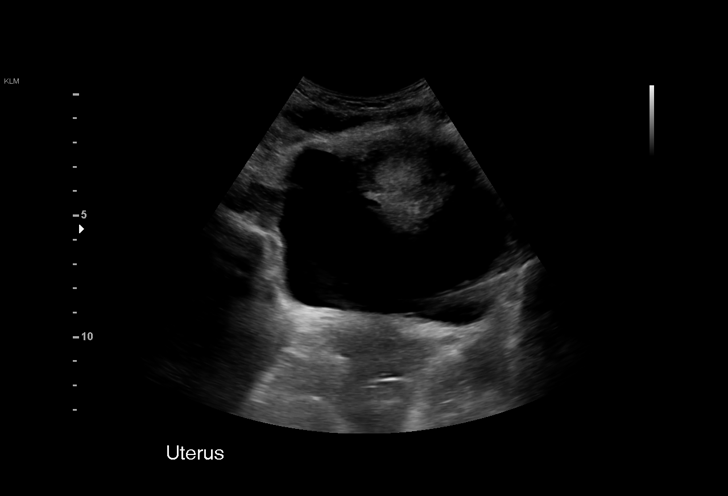
[im 9/40]
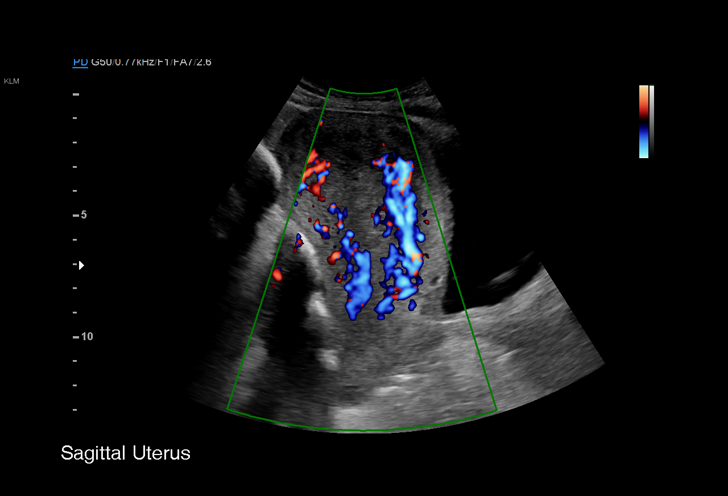
[im 12/40]
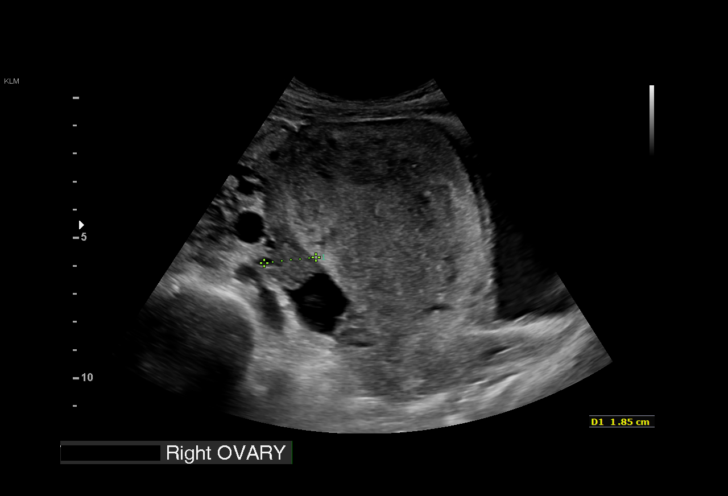
[im 15/40]
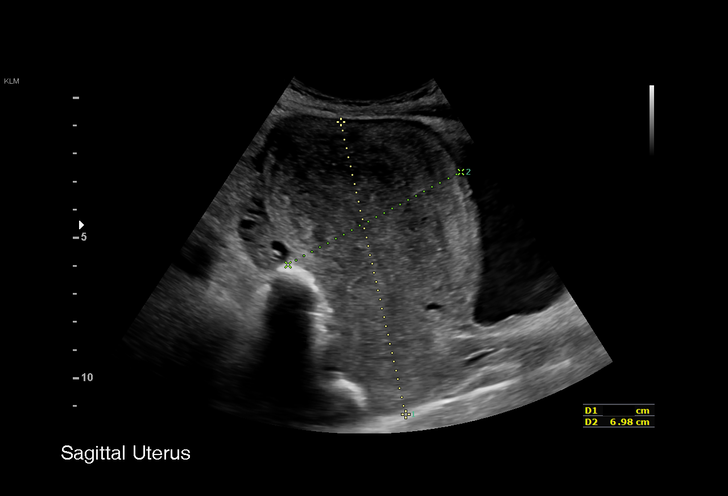
[im 17/40]
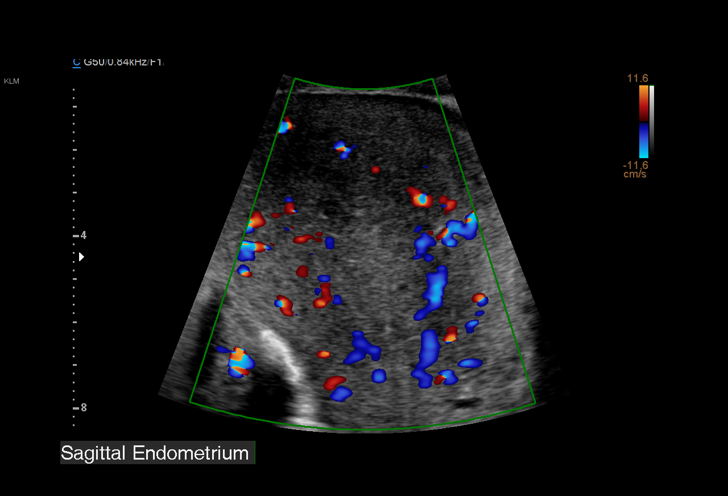
[im 20/40]
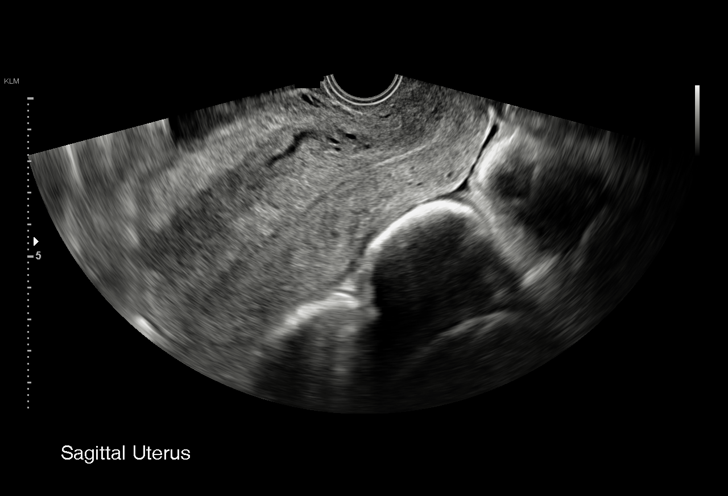
[im 23/40]
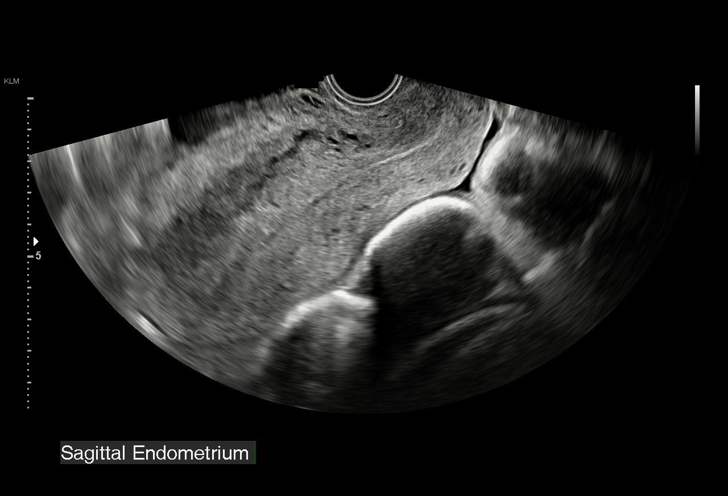
[im 25/40]
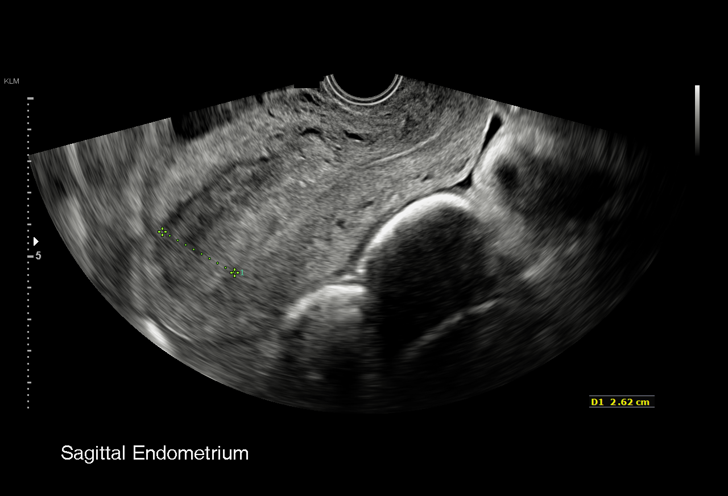
[im 28/40]
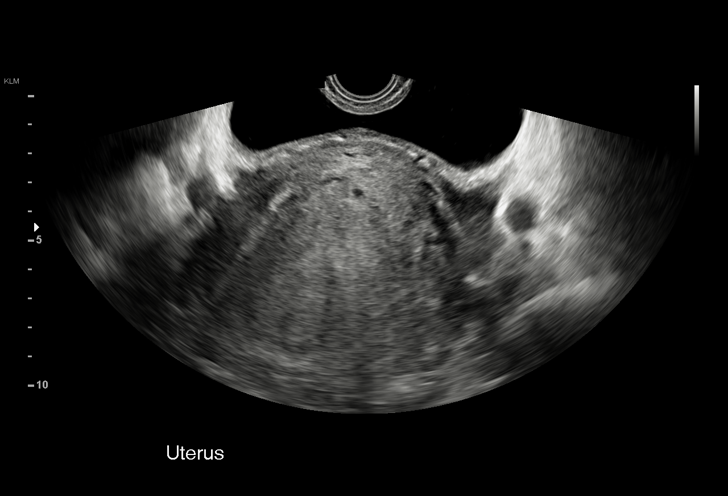
[im 31/40]
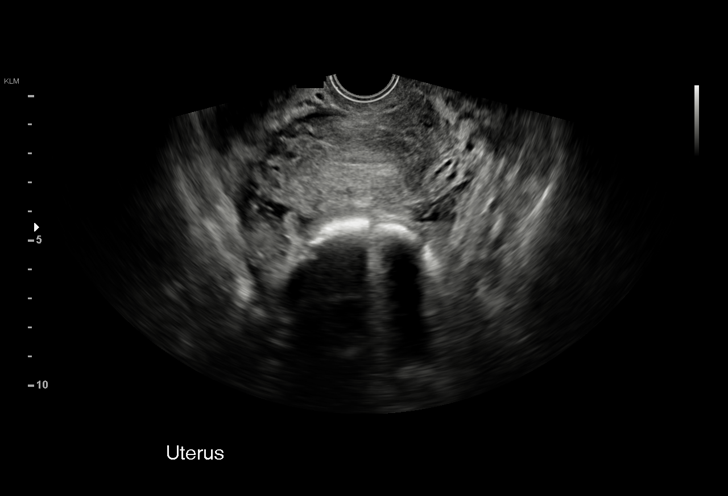
[im 33/40]
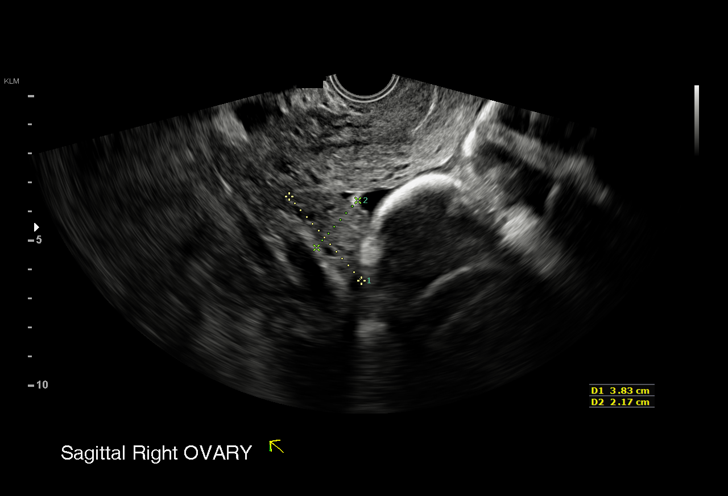
[im 36/40]
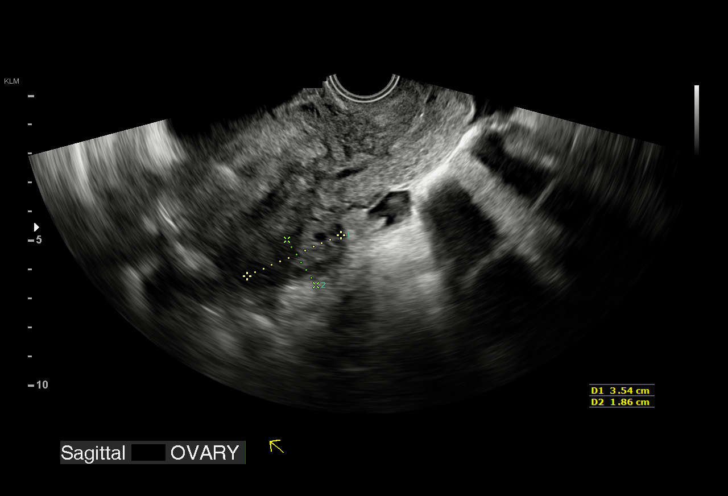
[im 40/40]
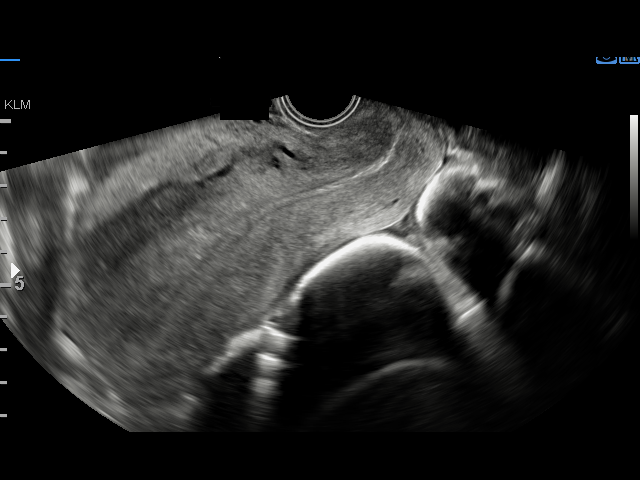

[15 of 25 positions shown; findings below may reference images not displayed]

FINDINGS: Uterus

Measurements: 10.7 x 6.9 x 8.9 cm. The uterus is anteverted and
appears heterogeneous. There are scattered small cystic spaces or
prominent myometrial vessels. There is increased vascularity and
flow in the myometrium.

Endometrium

Thickness: The endometrium is poorly visualized and suboptimally
evaluated.

Right ovary

Measurements: 4.2 x 2.1 x 1.9 cm. Normal appearance/no adnexal mass.

Left ovary

Measurements: 4.1 x 1.8 x 2.0 cm. Normal appearance/no adnexal mass.

Other findings

Small free fluid within the pelvis.
IMPRESSION: 1. Heterogeneous and hypervascular myometrium, likely related to
postpartum status.
2. Suboptimal evaluation of the endometrium.
3. Unremarkable ovaries.

## 2020-01-03 ENCOUNTER — Encounter (HOSPITAL_COMMUNITY): Payer: Self-pay | Admitting: Obstetrics and Gynecology

## 2020-01-03 ENCOUNTER — Inpatient Hospital Stay (HOSPITAL_BASED_OUTPATIENT_CLINIC_OR_DEPARTMENT_OTHER): Payer: Medicaid Other

## 2020-01-03 ENCOUNTER — Other Ambulatory Visit: Payer: Self-pay

## 2020-01-03 ENCOUNTER — Inpatient Hospital Stay (HOSPITAL_COMMUNITY)
Admission: EM | Admit: 2020-01-03 | Discharge: 2020-01-03 | Disposition: A | Discharge status: Home / Self Care | Payer: Medicaid Other | Attending: Obstetrics and Gynecology | Admitting: Obstetrics and Gynecology

## 2020-01-03 DIAGNOSIS — O0933 Supervision of pregnancy with insufficient antenatal care, third trimester: Secondary | ICD-10-CM

## 2020-01-03 DIAGNOSIS — O26893 Other specified pregnancy related conditions, third trimester: Secondary | ICD-10-CM | POA: Insufficient documentation

## 2020-01-03 DIAGNOSIS — O99323 Drug use complicating pregnancy, third trimester: Secondary | ICD-10-CM | POA: Diagnosis not present

## 2020-01-03 DIAGNOSIS — F419 Anxiety disorder, unspecified: Secondary | ICD-10-CM | POA: Diagnosis not present

## 2020-01-03 DIAGNOSIS — Z886 Allergy status to analgesic agent status: Secondary | ICD-10-CM | POA: Insufficient documentation

## 2020-01-03 DIAGNOSIS — O289 Unspecified abnormal findings on antenatal screening of mother: Secondary | ICD-10-CM | POA: Diagnosis not present

## 2020-01-03 DIAGNOSIS — F319 Bipolar disorder, unspecified: Secondary | ICD-10-CM | POA: Insufficient documentation

## 2020-01-03 DIAGNOSIS — O99343 Other mental disorders complicating pregnancy, third trimester: Secondary | ICD-10-CM | POA: Insufficient documentation

## 2020-01-03 DIAGNOSIS — Z3A32 32 weeks gestation of pregnancy: Secondary | ICD-10-CM | POA: Insufficient documentation

## 2020-01-03 DIAGNOSIS — O99891 Other specified diseases and conditions complicating pregnancy: Secondary | ICD-10-CM | POA: Diagnosis not present

## 2020-01-03 DIAGNOSIS — Z79899 Other long term (current) drug therapy: Secondary | ICD-10-CM | POA: Insufficient documentation

## 2020-01-03 DIAGNOSIS — O99333 Smoking (tobacco) complicating pregnancy, third trimester: Secondary | ICD-10-CM | POA: Diagnosis not present

## 2020-01-03 DIAGNOSIS — F1120 Opioid dependence, uncomplicated: Secondary | ICD-10-CM

## 2020-01-03 DIAGNOSIS — R109 Unspecified abdominal pain: Secondary | ICD-10-CM | POA: Diagnosis not present

## 2020-01-03 DIAGNOSIS — F17200 Nicotine dependence, unspecified, uncomplicated: Secondary | ICD-10-CM | POA: Diagnosis not present

## 2020-01-03 DIAGNOSIS — F1190 Opioid use, unspecified, uncomplicated: Secondary | ICD-10-CM | POA: Insufficient documentation

## 2020-01-03 DIAGNOSIS — O288 Other abnormal findings on antenatal screening of mother: Secondary | ICD-10-CM

## 2020-01-03 LAB — COMPREHENSIVE METABOLIC PANEL
ALT: 30 U/L (ref 0–44)
AST: 41 U/L (ref 15–41)
Albumin: 2.5 g/dL — ABNORMAL LOW (ref 3.5–5.0)
Alkaline Phosphatase: 99 U/L (ref 38–126)
Anion gap: 9 (ref 5–15)
BUN: 6 mg/dL (ref 6–20)
CO2: 24 mmol/L (ref 22–32)
Calcium: 8.3 mg/dL — ABNORMAL LOW (ref 8.9–10.3)
Chloride: 104 mmol/L (ref 98–111)
Creatinine, Ser: 0.52 mg/dL (ref 0.44–1.00)
GFR calc Af Amer: >60 mL/min (ref >60)
GFR calc non Af Amer: >60 mL/min (ref >60)
Glucose, Bld: 86 mg/dL (ref 70–99)
Potassium: 3.8 mmol/L (ref 3.5–5.1)
Sodium: 137 mmol/L (ref 135–145)
Total Bilirubin: 0.6 mg/dL (ref 0.3–1.2)
Total Protein: 5.5 g/dL — ABNORMAL LOW (ref 6.5–8.1)

## 2020-01-03 LAB — TYPE AND SCREEN
ABO/RH(D): O POS
Antibody Screen: NEGATIVE

## 2020-01-03 LAB — URINALYSIS, ROUTINE W REFLEX MICROSCOPIC
Bilirubin Urine: NEGATIVE
Glucose, UA: NEGATIVE mg/dL
Hgb urine dipstick: NEGATIVE
Ketones, ur: 20 mg/dL — AB
Nitrite: NEGATIVE
Protein, ur: NEGATIVE mg/dL
Specific Gravity, Urine: 1.013 (ref 1.005–1.030)
pH: 6 (ref 5.0–8.0)

## 2020-01-03 LAB — CBC
HCT: 30 % — ABNORMAL LOW (ref 36.0–46.0)
Hemoglobin: 10.5 g/dL — ABNORMAL LOW (ref 12.0–15.0)
MCH: 30.2 pg (ref 26.0–34.0)
MCHC: 35 g/dL (ref 30.0–36.0)
MCV: 86.2 fL (ref 80.0–100.0)
Platelets: 289 10*3/uL (ref 150–400)
RBC: 3.48 MIL/uL — ABNORMAL LOW (ref 3.87–5.11)
RDW: 12.4 % (ref 11.5–15.5)
WBC: 10.7 10*3/uL — ABNORMAL HIGH (ref 4.0–10.5)

## 2020-01-03 LAB — WET PREP, GENITAL
Sperm: NONE SEEN
Trich, Wet Prep: NONE SEEN
Yeast Wet Prep HPF POC: NONE SEEN

## 2020-01-03 LAB — RAPID URINE DRUG SCREEN, HOSP PERFORMED
Amphetamines: POSITIVE — AB
Barbiturates: NOT DETECTED
Benzodiazepines: POSITIVE — AB
Cocaine: NOT DETECTED
Opiates: NOT DETECTED
Tetrahydrocannabinol: POSITIVE — AB

## 2020-01-03 LAB — HIV ANTIBODY (ROUTINE TESTING W REFLEX): HIV Screen 4th Generation wRfx: NONREACTIVE

## 2020-01-03 LAB — PROTEIN / CREATININE RATIO, URINE
Creatinine, Urine: 114.12 mg/dL
Protein Creatinine Ratio: 0.11 mg/mg{Cre} (ref 0.00–0.15)
Total Protein, Urine: 12 mg/dL

## 2020-01-03 LAB — HEMOGLOBIN A1C
Hgb A1c MFr Bld: 4.8 % (ref 4.8–5.6)
Mean Plasma Glucose: 91.06 mg/dL

## 2020-01-03 LAB — HEPATITIS C ANTIBODY: HCV Ab: REACTIVE — AB

## 2020-01-03 LAB — HEPATITIS B SURFACE ANTIGEN: Hepatitis B Surface Ag: NONREACTIVE

## 2020-01-03 MED ORDER — LACTATED RINGERS IV BOLUS
1000.0000 mL | Freq: Once | INTRAVENOUS | Status: AC
  Administered 2020-01-03: 1000 mL via INTRAVENOUS

## 2020-01-03 NOTE — MAU Note (Signed)
Pt reports she started feeling pelvic pain and pressure that started this morning Does not feel like ctx. Denies any vag bleeding or leaking. reports she is feeling baby move but not as much. No prenatal care.

## 2020-01-03 NOTE — ED Provider Notes (Signed)
Patient placed in Quick Look pathway, seen and evaluated for Chief Complaint of abdominal pain in pregnancy.    34 yo female G6P4 estimates gestation 32 weeks and has not had prenatal care except for an [place] at 20 weeks at the [facility]. She never heard the results so she assumed it was normal. She is reporting abdominal cramping  since 10 AM this morning. She states it does not feel like contractions she has had in the past. The cramping is intermittent and not happening at regular intervals. She does not think her water has broken. She feels the baby moving the normal amount it typically moves.   ROS + abdominal cramping, -vaginal bleeding, -fever -nausea -emesis  Physical Exam  Vital signs reviewed. Tachycardic to 140, appears anxious.  Well developed. Patient sitting comfortably in wheelchair  Alert  No respiratory distress.   Gravid abdomen. Non tender to palpation.   Based on initial evaluation, labs are not indicated and radiology studies not indicated. Discussed case with MAU APP Joni Reining who accepts patient in transfer. We do not have pregnancy test in our system. Patient is obviously present on exam. At this time there are not signs of imminent delivery. Fetal heart tones ordered and I planned to get them prior to transport. Transport staff arrived before fetal heart tones assessed. It was communicated with MAU charge RN who assures she will get heart tones on arrival to MAU.   Patient counseled on process, plan, and necessity for staying for completing the evaluation.   Portions of this note were generated with Scientist, clinical (histocompatibility and immunogenetics). Dictation errors may occur despite best attempts at proofreading.    Sherene Sires, PA-C 01/03/20 1243    Sabas Sous, MD 01/03/20 [phone]

## 2020-01-03 NOTE — MAU Provider Note (Signed)
Chief Complaint:  Abdominal Pain   First Provider Initiated Contact with Patient 01/03/20 1407     HPI: Elizabeth Taylor is a 34 y.o. [id number] at [redacted]w[redacted]d who presents to maternity admissions reporting abdominal cramping. Symptoms started this morning. Symptoms resolved upon arrival to the hospital. Denies n/v/d, dysuria, vaginal bleeding, or vaginal discharge. Good fetal movement.  Has had no prenatal care. States at around 20 weeks she had an [facility] which gave her the due date of 9/9.  Reports a PTD at 33 wks with her first child but other 2 were full term. Denies complications with those pregnancies. She does not have custody of her other children.  Has opioid use disorder. Was on 60mg  of methadone prior to her pregnancy. States she is now taking 1 "pain pill" daily to avoid withdrawal symptoms. Does not know what the medication is but admits that it is not prescribed to her. Last took yesterday. Currently denies any symptoms.    Pregnancy Course: no prenatal care  Past Medical History:  Diagnosis Date  . Anxiety   . Bipolar 1 disorder (HCC)    OB History  Gravida Para Term Preterm AB Living  6 3 2 1 2 3   SAB TAB Ectopic Multiple Live Births  2       3    # Outcome Date GA Lbr Len/2nd Weight Sex Delivery Anes PTL Lv  6 Current           5 Term 2019     Vag-Spont   LIV  4 Term 2017     Vag-Spont   LIV  3 Preterm 2015 [redacted]w[redacted]d    Vag-Spont  Y LIV  2 SAB           1 SAB             Obstetric Comments  Does not have custody of her children   Past Surgical History:  Procedure Laterality Date  . MASTECTOMY    . WISDOM TOOTH EXTRACTION     No family history on file. Social History   Tobacco Use  . Smoking status: Current Some Day Smoker  . Smokeless tobacco: Never Used  Vaping Use  . Vaping Use: Never used  Substance Use Topics  . Alcohol use: No  . Drug use: Yes    Types: Marijuana    Comment: NO DRUGS DURING  PREG   Allergies  Allergen  Reactions  . Naproxen Hives    Aleve   No medications prior to admission.    I have reviewed patient's Past Medical Hx, Surgical Hx, Family Hx, Social Hx, medications and allergies.   ROS:  Review of Systems  Constitutional: Negative.   Gastrointestinal: Positive for abdominal pain (none currently). Negative for diarrhea, nausea and vomiting.  Genitourinary: Negative.   Neurological: Negative.     Physical Exam   Patient Vitals for the past 24 hrs:  BP Temp Temp src Pulse Resp SpO2 Height Weight  01/03/20 1750 114/84 -- -- -- -- -- -- --  01/03/20 1603 112/79 97.7 F (36.5 C) Oral (!) 117 12 100 % -- --  01/03/20 1511 109/64 -- -- (!) 115 -- -- -- --  01/03/20 1332 129/88 -- -- (!) 127 -- -- -- --  01/03/20 1303 (!) 126/97 -- -- (!) 125 18 -- 5\' 4"  (1.626 m) 71.3 kg  01/03/20 1217 (!) 125/98 99.1 F (37.3 C) Oral (!) 140 18 100 % -- --  Constitutional: Well-developed, well-nourished female in no acute distress.  Cardiovascular: normal rate & rhythm, no murmur Respiratory: normal effort, lung sounds clear throughout GI: Fundal height 32 cm. Abd soft, non-tender, gravid appropriate for gestational age. Pos BS x 4 MS: Extremities nontender, no edema, normal ROM Neurologic: Alert and oriented x 4.  GU: NEFG, no blood, no abnormal discharge  Dilation: Closed Effacement (%): 50 Cervical Position: Middle Station: -3 Presentation: Vertex (by BSUS) Exam by:: Judeth Horn NP  Fetal Tracing:  Baseline: 150 Variability: min to mod Accelerations: none Decelerations:variable  Toco: none    Labs: Results for orders placed or performed during the hospital encounter of 01/03/20 (from the past 24 hour(s))  Wet prep, genital     Status: Abnormal   Collection Time: 01/03/20  2:34 PM  Result Value Ref Range   Yeast Wet Prep HPF POC NONE SEEN NONE SEEN   Trich, Wet Prep NONE SEEN NONE SEEN   Clue Cells Wet Prep HPF POC PRESENT (A) NONE SEEN   WBC, Wet Prep HPF POC MODERATE  (A) NONE SEEN   Sperm NONE SEEN   CBC     Status: Abnormal   Collection Time: 01/03/20  2:37 PM  Result Value Ref Range   WBC 10.7 (H) 4.0 - 10.5 K/uL   RBC 3.48 (L) 3.87 - 5.11 MIL/uL   Hemoglobin 10.5 (L) 12.0 - 15.0 g/dL   HCT 29.9 (L) 36 - 46 %   MCV 86.2 80.0 - 100.0 fL   MCH 30.2 26.0 - 34.0 pg   MCHC 35.0 30.0 - 36.0 g/dL   RDW 24.2 68.3 - 41.9 %   Platelets 289 150 - 400 K/uL  HIV Antibody (routine testing w rflx)     Status: None   Collection Time: 01/03/20  2:37 PM  Result Value Ref Range   HIV Screen 4th Generation wRfx Non Reactive Non Reactive  Type and screen     Status: None   Collection Time: 01/03/20  2:37 PM  Result Value Ref Range   ABO/RH(D) O POS    Antibody Screen NEG    Sample Expiration      01/06/2020,2359 Performed at [facility], [address] [place], [place] [address]   Comprehensive metabolic panel     Status: Abnormal   Collection Time: 01/03/20  2:37 PM  Result Value Ref Range   Sodium 137 135 - 145 mmol/L   Potassium 3.8 3.5 - 5.1 mmol/L   Chloride 104 98 - 111 mmol/L   CO2 24 22 - 32 mmol/L   Glucose, Bld 86 70 - 99 mg/dL   BUN 6 6 - 20 mg/dL   Creatinine, Ser 7.98 0.44 - 1.00 mg/dL   Calcium 8.3 (L) 8.9 - 10.3 mg/dL   Total Protein 5.5 (L) 6.5 - 8.1 g/dL   Albumin 2.5 (L) 3.5 - 5.0 g/dL   AST 41 15 - 41 U/L   ALT 30 0 - 44 U/L   Alkaline Phosphatase 99 38 - 126 U/L   Total Bilirubin 0.6 0.3 - 1.2 mg/dL   GFR calc non Af Amer >60 >60 mL/min   GFR calc Af Amer >60 >60 mL/min   Anion gap 9 5 - 15  Hemoglobin A1c     Status: None   Collection Time: 01/03/20  2:37 PM  Result Value Ref Range   Hgb A1c MFr Bld 4.8 4.8 - 5.6 %   Mean Plasma Glucose 91.06 mg/dL  Urinalysis, Routine w reflex microscopic  Status: Abnormal   Collection Time: 01/03/20  3:06 PM  Result Value Ref Range   Color, Urine AMBER (A) YELLOW   APPearance CLOUDY (A) CLEAR   Specific Gravity, Urine 1.013 1.005 - 1.030   pH 6.0 5.0 - 8.0   Glucose, UA  NEGATIVE NEGATIVE mg/dL   Hgb urine dipstick NEGATIVE NEGATIVE   Bilirubin Urine NEGATIVE NEGATIVE   Ketones, ur 20 (A) NEGATIVE mg/dL   Protein, ur NEGATIVE NEGATIVE mg/dL   Nitrite NEGATIVE NEGATIVE   Leukocytes,Ua MODERATE (A) NEGATIVE   RBC / HPF 0-5 0 - 5 RBC/hpf   WBC, UA 21-50 0 - 5 WBC/hpf   Bacteria, UA MANY (A) NONE SEEN   Squamous Epithelial / LPF 6-10 0 - 5   Mucus PRESENT    Non Squamous Epithelial 0-5 (A) NONE SEEN  Rapid urine drug screen (hospital performed)     Status: Abnormal   Collection Time: 01/03/20  3:06 PM  Result Value Ref Range   Opiates NONE DETECTED NONE DETECTED   Cocaine NONE DETECTED NONE DETECTED   Benzodiazepines POSITIVE (A) NONE DETECTED   Amphetamines POSITIVE (A) NONE DETECTED   Tetrahydrocannabinol POSITIVE (A) NONE DETECTED   Barbiturates NONE DETECTED NONE DETECTED  Protein / creatinine ratio, urine     Status: None   Collection Time: 01/03/20  3:06 PM  Result Value Ref Range   Creatinine, Urine 114.12 mg/dL   Total Protein, Urine 12 mg/dL   Protein Creatinine Ratio 0.11 0.00 - 0.15 mg/mg[Cre]    Imaging:  No results found.  MAU Course: Orders Placed This Encounter  Procedures  . Culture, OB Urine  . Wet prep, genital  . US MFM FETAL BPP WO NON STRESS  . US MFM OB DETAIL +14 WK  . Urinalysis, Routine w reflex microscopic  . Rapid urine drug screen (hospital performed)  . CBC  . RPR  . HIV Antibody (routine testing w rflx)  . Rubella screen  . Hepatitis C antibody  . Comprehensive metabolic panel  . Protein / creatinine ratio, urine  . Hemoglobin A1c  . Hepatitis B surface antigen  . Diet regular Room service appropriate? Yes; Fluid consistency: Thin  . Type and screen  . Discharge patient   Meds ordered this encounter  Medications  . lactated ringers bolus 1,000 mL    MDM: Initial BPs elevated. Normalized after patient calmed down in MAU.  Preeclampsia labs negative, pt with no htn history, and asymptomatic.    Some irregular contractions on monitor. Patient states pain has resolved. Cervix closed. Unable to collect FFN due to recent intercourse.   No prenatal care. Prenatal labs ordered. Patient reports OUD. UDS positive for amphetamines, marijuana, and benzos. Negative for opiates. No withdrawal symptoms. Patient not a candidate to start suboxone at this time. [facility]   Non reactive tracing. BPP 6/8, off for breathing. After IV fluids & food, fetal tracing with moderate variability & 10x10. Tracing reviewed by Dr. Macon LargeAnyanwu. Ok to discharge home.  Outpatient anatomy ultrasound ordered. New OB labs pending. [facility] be [facility]  Assessment: 1. Abdominal cramping affecting pregnancy, antepartum   2. Non-reactive NST (non-stress test)   3. [redacted] weeks gestation of pregnancy   4. No prenatal care in current pregnancy in third trimester   5. Drug use affecting pregnancy in third trimester     Plan: Discharge home in stable condition.  Reviewed reasons to return to  MAU New ob labs pending Anatomy ultrasound ordered Msg to [facility] for new ob appt    Follow-up Information    [facility] Follow up.   Specialty: Maternal and Fetal Medicine Why: call to schedule anatomy ultrasound Contact information: [address], [address] [place] [address] [phone]       [facility] [employer] at [facility] Follow up.   Specialty: Obstetrics and Gynecology Why: office will call to schedule ob appointment Contact information: [address] [place] [address] [phone]              Allergies as of 01/03/2020      Reactions   Naproxen Hives   Aleve      Medication List    STOP taking these medications   buprenorphine 8 MG Subl SL tablet Commonly known as: SUBUTEX     TAKE these medications   ALPRAZolam 1  MG tablet Commonly known as: XANAX Take 1 mg by mouth as needed for anxiety.   amphetamine-dextroamphetamine 20 MG 24 hr capsule Commonly known as: ADDERALL XR Take 20 mg by mouth as needed.   diphenhydrAMINE 25 MG tablet Commonly known as: BENADRYL Take 25 mg by mouth every 6 (six) hours as needed for allergies.   prenatal multivitamin Tabs tablet Take 1 tablet by mouth daily at 12 noon.       Judeth Horn, NP 01/03/2020 6:46 PM

## 2020-01-03 NOTE — Discharge Instructions (Signed)
Fetal Movement Counts Patient Name: ________________________________________________ Patient Due Date: ____________________ What is a fetal movement count?  A fetal movement count is the number of times that you feel your baby move during a certain amount of time. This may also be called a fetal kick count. A fetal movement count is recommended for every pregnant woman. You may be asked to start counting fetal movements as early as week 28 of your pregnancy. Pay attention to when your baby is most active. You may notice your baby's sleep and wake cycles. You may also notice things that make your baby move more. You should do a fetal movement count:  When your baby is normally most active.  At the same time each day. A good time to count movements is while you are resting, after having something to eat and drink. How do I count fetal movements? 1. Find a quiet, comfortable area. Sit, or lie down on your side. 2. Write down the date, the start time and stop time, and the number of movements that you felt between those two times. Take this information with you to your health care visits. 3. Write down your start time when you feel the first movement. 4. Count kicks, flutters, swishes, rolls, and jabs. You should feel at least 10 movements. 5. You may stop counting after you have felt 10 movements, or if you have been counting for 2 hours. Write down the stop time. 6. If you do not feel 10 movements in 2 hours, contact your health care provider for further instructions. Your health care provider may want to do additional tests to assess your baby's well-being. Contact a health care provider if:  You feel fewer than 10 movements in 2 hours.  Your baby is not moving like he or she usually does. Date: ____________ Start time: ____________ Stop time: ____________ Movements: ____________ Date: ____________ Start time: ____________ Stop time: ____________ Movements: ____________ Date: ____________  Start time: ____________ Stop time: ____________ Movements: ____________ Date: ____________ Start time: ____________ Stop time: ____________ Movements: ____________ Date: ____________ Start time: ____________ Stop time: ____________ Movements: ____________ Date: ____________ Start time: ____________ Stop time: ____________ Movements: ____________ Date: ____________ Start time: ____________ Stop time: ____________ Movements: ____________ Date: ____________ Start time: ____________ Stop time: ____________ Movements: ____________ Date: ____________ Start time: ____________ Stop time: ____________ Movements: ____________ This information is not intended to replace advice given to you by your health care provider. Make sure you discuss any questions you have with your health care provider. Document Revised: 01/25/2019 Document Reviewed: 01/25/2019 Elsevier Patient Education  2020 [employer].       Preventing Illegal Drug Use During Pregnancy While you are pregnant, everything that you take into your body affects you and your baby. Illegal drug use during pregnancy is dangerous for your health and your baby's health. Illegal or street drugs are never safe to use during pregnancy. This includes drugs such as:  Cocaine.  MDMA, also called ecstasy.  Amphetamines and methamphetamines.  Heroin.  Marijuana. This may be legal in some states but should not be used during pregnancy. Abusing prescription medicines, such as pain killers, is also a form of illegal drug use. This means taking medicines differently than how they were prescribed, such as in higher doses or for different reasons. This kind of illegal drug use can also be harmful to you and your baby. If you have questions or concerns about your drug or prescription medicine use, work with your health care provider. How can  using illegal drugs during pregnancy affect me and my baby? Using illegal drugs or abusing prescription medicines  during pregnancy raises your risk for:  Sudden loss of your baby.  Problems with the organ in your womb (uterus) that provides your baby with nutrients, blood, and oxygen (placenta).  Going into labor early (preterm labor), which can put your baby at risk for serious problems.  Using alcohol or other drugs during pregnancy.  Certain infections, including: ? HIV (human immunodeficiency virus). ? Hepatitis. ? STIs (sexually transmitted infections). Illegal drug use can also cause serious problems for your baby, such as:  Being born too early (prematurely).  Breathing problems, heart problems, or other birth defects.  Low birth weight.  An abnormally small head (microcephaly).  Lifelong developmental or learning disabilities. Illegal drug use can lead to dependence or addiction. Dependence or addiction can cause unpleasant and dangerous symptoms when you stop using the drug (withdrawal). The same can happen to your baby if you use drugs during pregnancy, and your baby may have symptoms of withdrawal after birth (neonatal abstinence syndrome). What are the benefits of avoiding illegal drug use during pregnancy? Not using illegal drugs or abusing prescription medicines is the best way to have a healthy pregnancy and baby. This allows you to:  Be physically and mentally healthier.  Avoid possible legal problems related to drug use.  Give your baby a healthy start in life. What actions can I take?  Do not take any medicine during pregnancy unless it has been prescribed for you to take during your pregnancy.  If you think you may become pregnant, do not use illegal drugs. If you use illegal drugs and have trouble quitting, ask your health care provider for help.  If you are using drugs during pregnancy, get help as soon as possible. Treatment programs during pregnancy are focused on helping you: ? Safely stop illegal drug use. ? Get healthy and stay healthy. ? Have a healthy  pregnancy. ? Work with a Chief Technology Officer. Where to find more information You can find more information about preventing illegal drug use during pregnancy from:  March of Dimes: www.marchofdimes.org  American Pregnancy Association: americanpregnancy.org  [employer] of Obstetricians and Gynecologists: www.acog.org Contact a health care provider if:   You become pregnant or think you may become pregnant while you are: ? Taking a prescription medicine. ? Using an illegal drug.  You are unable to stop using a drug.  You are pregnant and having withdrawal symptoms. Summary  Illegal drug use during pregnancy can cause serious health problems for you and your baby.  Abusing prescription medicines is a form of illegal drug use that can be just as dangerous as using street drugs.  Tell your health care provider right away if you get pregnant while taking any drug, including prescription medicines. This information is not intended to replace advice given to you by your health care provider. Make sure you discuss any questions you have with your health care provider. Document Revised: 03/02/2019 Document Reviewed: 05/03/2017 Elsevier Patient Education  2020 [employer].

## 2020-01-04 ENCOUNTER — Encounter: Payer: Self-pay | Admitting: Student

## 2020-01-04 ENCOUNTER — Other Ambulatory Visit: Payer: Self-pay | Admitting: Student

## 2020-01-04 DIAGNOSIS — R768 Other specified abnormal immunological findings in serum: Secondary | ICD-10-CM

## 2020-01-04 DIAGNOSIS — O99323 Drug use complicating pregnancy, third trimester: Secondary | ICD-10-CM | POA: Insufficient documentation

## 2020-01-04 DIAGNOSIS — O0933 Supervision of pregnancy with insufficient antenatal care, third trimester: Secondary | ICD-10-CM | POA: Insufficient documentation

## 2020-01-04 LAB — RUBELLA SCREEN: Rubella: 1.26 index (ref >0.99)

## 2020-01-04 LAB — GC/CHLAMYDIA PROBE AMP (~~LOC~~) NOT AT ARMC
Chlamydia: NEGATIVE
Comment: NEGATIVE
Comment: NORMAL
Neisseria Gonorrhea: NEGATIVE

## 2020-01-04 LAB — RPR: RPR Ser Ql: NONREACTIVE

## 2020-01-05 LAB — HCV RNA QUANT: HCV Quantitative: NOT DETECTED IU/mL (ref >50)

## 2020-01-05 LAB — CULTURE, OB URINE: Culture: >=100000 — AB

## 2020-01-06 ENCOUNTER — Other Ambulatory Visit: Payer: Self-pay | Admitting: Certified Nurse Midwife

## 2020-01-06 MED ORDER — CEPHALEXIN 500 MG PO CAPS
500.0000 mg | ORAL_CAPSULE | Freq: Four times a day (QID) | ORAL | 0 refills | Status: DC
Start: 2020-01-06 — End: 2020-03-08

## 2020-01-31 ENCOUNTER — Encounter: Payer: Medicaid Other | Admitting: Family Medicine

## 2020-02-04 ENCOUNTER — Encounter: Payer: Medicaid Other | Admitting: Family Medicine

## 2020-02-08 ENCOUNTER — Encounter: Payer: Medicaid Other | Admitting: Family Medicine

## 2020-03-06 ENCOUNTER — Encounter (HOSPITAL_COMMUNITY): Payer: Self-pay | Admitting: Obstetrics & Gynecology

## 2020-03-06 ENCOUNTER — Inpatient Hospital Stay (HOSPITAL_COMMUNITY)
Admission: AD | Admit: 2020-03-06 | Discharge: 2020-03-08 | DRG: 806 | Disposition: A | Discharge status: Home / Self Care | Payer: Medicaid Other | Attending: Obstetrics and Gynecology | Admitting: Obstetrics and Gynecology

## 2020-03-06 ENCOUNTER — Other Ambulatory Visit: Payer: Self-pay

## 2020-03-06 DIAGNOSIS — O99334 Smoking (tobacco) complicating childbirth: Secondary | ICD-10-CM | POA: Diagnosis present

## 2020-03-06 DIAGNOSIS — Z3A41 41 weeks gestation of pregnancy: Secondary | ICD-10-CM

## 2020-03-06 DIAGNOSIS — F17200 Nicotine dependence, unspecified, uncomplicated: Secondary | ICD-10-CM | POA: Diagnosis present

## 2020-03-06 DIAGNOSIS — Z20822 Contact with and (suspected) exposure to covid-19: Secondary | ICD-10-CM | POA: Diagnosis present

## 2020-03-06 DIAGNOSIS — O326XX0 Maternal care for compound presentation, not applicable or unspecified: Secondary | ICD-10-CM

## 2020-03-06 DIAGNOSIS — O99323 Drug use complicating pregnancy, third trimester: Secondary | ICD-10-CM

## 2020-03-06 DIAGNOSIS — O99324 Drug use complicating childbirth: Secondary | ICD-10-CM | POA: Diagnosis present

## 2020-03-06 DIAGNOSIS — O480 Post-term pregnancy: Secondary | ICD-10-CM | POA: Diagnosis present

## 2020-03-06 DIAGNOSIS — F1120 Opioid dependence, uncomplicated: Secondary | ICD-10-CM

## 2020-03-06 DIAGNOSIS — R768 Other specified abnormal immunological findings in serum: Secondary | ICD-10-CM

## 2020-03-06 DIAGNOSIS — F1190 Opioid use, unspecified, uncomplicated: Secondary | ICD-10-CM | POA: Diagnosis present

## 2020-03-06 DIAGNOSIS — Z23 Encounter for immunization: Secondary | ICD-10-CM

## 2020-03-06 DIAGNOSIS — O1414 Severe pre-eclampsia complicating childbirth: Secondary | ICD-10-CM | POA: Diagnosis present

## 2020-03-06 DIAGNOSIS — F1920 Other psychoactive substance dependence, uncomplicated: Secondary | ICD-10-CM

## 2020-03-06 DIAGNOSIS — O0933 Supervision of pregnancy with insufficient antenatal care, third trimester: Secondary | ICD-10-CM

## 2020-03-06 LAB — RAPID URINE DRUG SCREEN, HOSP PERFORMED
Amphetamines: NOT DETECTED
Barbiturates: NOT DETECTED
Benzodiazepines: POSITIVE — AB
Cocaine: NOT DETECTED
Opiates: POSITIVE — AB
Tetrahydrocannabinol: NOT DETECTED

## 2020-03-06 LAB — CBC
HCT: 29.3 % — ABNORMAL LOW (ref 36.0–46.0)
Hemoglobin: 10 g/dL — ABNORMAL LOW (ref 12.0–15.0)
MCH: 29.3 pg (ref 26.0–34.0)
MCHC: 34.1 g/dL (ref 30.0–36.0)
MCV: 85.9 fL (ref 80.0–100.0)
Platelets: 192 10*3/uL (ref 150–400)
RBC: 3.41 MIL/uL — ABNORMAL LOW (ref 3.87–5.11)
RDW: 13.9 % (ref 11.5–15.5)
WBC: 13.8 10*3/uL — ABNORMAL HIGH (ref 4.0–10.5)
nRBC: 0 % (ref 0.0–0.2)

## 2020-03-06 LAB — PROTEIN / CREATININE RATIO, URINE
Creatinine, Urine: 59.08 mg/dL
Protein Creatinine Ratio: 0.14 mg/mg{Cre} (ref 0.00–0.15)
Total Protein, Urine: 8 mg/dL

## 2020-03-06 LAB — COMPREHENSIVE METABOLIC PANEL
ALT: 10 U/L (ref 0–44)
AST: 19 U/L (ref 15–41)
Albumin: 2.2 g/dL — ABNORMAL LOW (ref 3.5–5.0)
Alkaline Phosphatase: 172 U/L — ABNORMAL HIGH (ref 38–126)
Anion gap: 11 (ref 5–15)
BUN: 7 mg/dL (ref 6–20)
CO2: 20 mmol/L — ABNORMAL LOW (ref 22–32)
Calcium: 8 mg/dL — ABNORMAL LOW (ref 8.9–10.3)
Chloride: 105 mmol/L (ref 98–111)
Creatinine, Ser: 0.62 mg/dL (ref 0.44–1.00)
GFR calc Af Amer: >60 mL/min (ref >60)
GFR calc non Af Amer: >60 mL/min (ref >60)
Glucose, Bld: 68 mg/dL — ABNORMAL LOW (ref 70–99)
Potassium: 4.2 mmol/L (ref 3.5–5.1)
Sodium: 136 mmol/L (ref 135–145)
Total Bilirubin: 0.3 mg/dL (ref 0.3–1.2)
Total Protein: 5.1 g/dL — ABNORMAL LOW (ref 6.5–8.1)

## 2020-03-06 LAB — GROUP B STREP BY PCR: Group B strep by PCR: NEGATIVE

## 2020-03-06 LAB — TYPE AND SCREEN
ABO/RH(D): O POS
Antibody Screen: NEGATIVE

## 2020-03-06 LAB — SARS CORONAVIRUS 2 BY RT PCR (HOSPITAL ORDER, PERFORMED IN ~~LOC~~ HOSPITAL LAB): SARS Coronavirus 2: NEGATIVE

## 2020-03-06 MED ORDER — DIPHENHYDRAMINE HCL 50 MG/ML IJ SOLN: 12.5000 mg | INTRAMUSCULAR | Status: DC | PRN

## 2020-03-06 MED ORDER — ACETAMINOPHEN 325 MG PO TABS
650.0000 mg | ORAL_TABLET | ORAL | Status: DC | PRN
  Filled 2020-03-06: qty 2

## 2020-03-06 MED ORDER — LABETALOL HCL 5 MG/ML IV SOLN: 80.0000 mg | INTRAVENOUS | Status: DC | PRN

## 2020-03-06 MED ORDER — EPHEDRINE 5 MG/ML INJ: 10.0000 mg | INTRAVENOUS | Status: DC | PRN

## 2020-03-06 MED ORDER — ACETAMINOPHEN 325 MG PO TABS
650.0000 mg | ORAL_TABLET | ORAL | Status: DC | PRN
  Administered 2020-03-07: 650 mg via ORAL

## 2020-03-06 MED ORDER — MAGNESIUM SULFATE BOLUS VIA INFUSION
4.0000 g | Freq: Once | INTRAVENOUS | Status: AC
  Administered 2020-03-06: 4 g via INTRAVENOUS
  Filled 2020-03-06: qty 1000

## 2020-03-06 MED ORDER — OXYTOCIN-SODIUM CHLORIDE 30-0.9 UT/500ML-% IV SOLN
2.5000 [IU]/h | INTRAVENOUS | Status: DC
  Administered 2020-03-06: 2.5 [IU]/h via INTRAVENOUS
  Filled 2020-03-06: qty 500

## 2020-03-06 MED ORDER — ONDANSETRON HCL 4 MG/2ML IJ SOLN: 4.0000 mg | Freq: Four times a day (QID) | INTRAMUSCULAR | Status: DC | PRN

## 2020-03-06 MED ORDER — PRENATAL MULTIVITAMIN CH
1.0000 | ORAL_TABLET | Freq: Every day | ORAL | Status: DC
  Administered 2020-03-07 – 2020-03-08 (×2): 1 via ORAL
  Filled 2020-03-06 (×2): qty 1

## 2020-03-06 MED ORDER — MAGNESIUM SULFATE 40 GM/1000ML IV SOLN
INTRAVENOUS | Status: AC
  Filled 2020-03-06: qty 1000

## 2020-03-06 MED ORDER — MAGNESIUM SULFATE 40 GM/1000ML IV SOLN
2.0000 g/h | INTRAVENOUS | Status: DC
  Administered 2020-03-07: 2 g/h via INTRAVENOUS
  Filled 2020-03-06: qty 1000

## 2020-03-06 MED ORDER — HYDRALAZINE HCL 20 MG/ML IJ SOLN: 10.0000 mg | INTRAMUSCULAR | Status: DC | PRN

## 2020-03-06 MED ORDER — IBUPROFEN 600 MG PO TABS
600.0000 mg | ORAL_TABLET | Freq: Once | ORAL | Status: AC
  Administered 2020-03-06: 600 mg via ORAL
  Filled 2020-03-06: qty 1

## 2020-03-06 MED ORDER — LACTATED RINGERS IV SOLN: 500.0000 mL | Freq: Once | INTRAVENOUS | Status: DC

## 2020-03-06 MED ORDER — OXYCODONE-ACETAMINOPHEN 5-325 MG PO TABS: 2.0000 | ORAL_TABLET | ORAL | Status: DC | PRN

## 2020-03-06 MED ORDER — OXYTOCIN BOLUS FROM INFUSION: 333.0000 mL | Freq: Once | INTRAVENOUS | Status: DC

## 2020-03-06 MED ORDER — ERYTHROMYCIN 5 MG/GM OP OINT
TOPICAL_OINTMENT | OPHTHALMIC | Status: AC
  Filled 2020-03-06: qty 1

## 2020-03-06 MED ORDER — LIDOCAINE HCL (PF) 1 % IJ SOLN: 30.0000 mL | INTRAMUSCULAR | Status: DC | PRN

## 2020-03-06 MED ORDER — LABETALOL HCL 5 MG/ML IV SOLN: 40.0000 mg | INTRAVENOUS | Status: DC | PRN

## 2020-03-06 MED ORDER — ONDANSETRON HCL 4 MG/2ML IJ SOLN: 4.0000 mg | INTRAMUSCULAR | Status: DC | PRN

## 2020-03-06 MED ORDER — PHENYLEPHRINE 40 MCG/ML (10ML) SYRINGE FOR IV PUSH (FOR BLOOD PRESSURE SUPPORT): 80.0000 ug | PREFILLED_SYRINGE | INTRAVENOUS | Status: DC | PRN

## 2020-03-06 MED ORDER — SENNOSIDES-DOCUSATE SODIUM 8.6-50 MG PO TABS
2.0000 | ORAL_TABLET | ORAL | Status: DC
  Administered 2020-03-06 – 2020-03-07 (×2): 2 via ORAL
  Filled 2020-03-06 (×2): qty 2

## 2020-03-06 MED ORDER — MEASLES, MUMPS & RUBELLA VAC IJ SOLR: 0.5000 mL | Freq: Once | INTRAMUSCULAR | Status: DC

## 2020-03-06 MED ORDER — IBUPROFEN 600 MG PO TABS
600.0000 mg | ORAL_TABLET | Freq: Four times a day (QID) | ORAL | Status: DC
  Administered 2020-03-06 – 2020-03-08 (×6): 600 mg via ORAL
  Filled 2020-03-06 (×8): qty 1

## 2020-03-06 MED ORDER — LACTATED RINGERS IV SOLN: INTRAVENOUS | Status: DC

## 2020-03-06 MED ORDER — LACTATED RINGERS IV SOLN: 500.0000 mL | INTRAVENOUS | Status: DC | PRN

## 2020-03-06 MED ORDER — SIMETHICONE 80 MG PO CHEW: 80.0000 mg | CHEWABLE_TABLET | ORAL | Status: DC | PRN

## 2020-03-06 MED ORDER — COCONUT OIL OIL: 1.0000 "application " | TOPICAL_OIL | Status: DC | PRN

## 2020-03-06 MED ORDER — DIBUCAINE (PERIANAL) 1 % EX OINT: 1.0000 "application " | TOPICAL_OINTMENT | CUTANEOUS | Status: DC | PRN

## 2020-03-06 MED ORDER — TETANUS-DIPHTH-ACELL PERTUSSIS 5-2.5-18.5 LF-MCG/0.5 IM SUSP
0.5000 mL | Freq: Once | INTRAMUSCULAR | Status: AC
  Administered 2020-03-08: 0.5 mL via INTRAMUSCULAR
  Filled 2020-03-06: qty 0.5

## 2020-03-06 MED ORDER — LABETALOL HCL 5 MG/ML IV SOLN: 20.0000 mg | INTRAVENOUS | Status: DC | PRN

## 2020-03-06 MED ORDER — OXYCODONE-ACETAMINOPHEN 5-325 MG PO TABS: 1.0000 | ORAL_TABLET | ORAL | Status: DC | PRN

## 2020-03-06 MED ORDER — FENTANYL-BUPIVACAINE-NACL 0.5-0.125-0.9 MG/250ML-% EP SOLN: 12.0000 mL/h | EPIDURAL | Status: DC | PRN

## 2020-03-06 MED ORDER — FENTANYL-BUPIVACAINE-NACL 0.5-0.125-0.9 MG/250ML-% EP SOLN
EPIDURAL | Status: DC
Start: 2020-03-06 — End: 2020-03-06
  Filled 2020-03-06: qty 250

## 2020-03-06 MED ORDER — WITCH HAZEL-GLYCERIN EX PADS: 1.0000 "application " | MEDICATED_PAD | CUTANEOUS | Status: DC | PRN

## 2020-03-06 MED ORDER — ONDANSETRON HCL 4 MG PO TABS: 4.0000 mg | ORAL_TABLET | ORAL | Status: DC | PRN

## 2020-03-06 MED ORDER — SOD CITRATE-CITRIC ACID 500-334 MG/5ML PO SOLN: 30.0000 mL | ORAL | Status: DC | PRN

## 2020-03-06 MED ORDER — DIPHENHYDRAMINE HCL 25 MG PO CAPS: 25.0000 mg | ORAL_CAPSULE | Freq: Four times a day (QID) | ORAL | Status: DC | PRN

## 2020-03-06 MED ORDER — BENZOCAINE-MENTHOL 20-0.5 % EX AERO: 1.0000 "application " | INHALATION_SPRAY | CUTANEOUS | Status: DC | PRN

## 2020-03-06 NOTE — MAU Note (Signed)
Pt reports ctx q 10 min some brown liquid leaking. Good fetal movement felt.

## 2020-03-06 NOTE — Discharge Summary (Signed)
Postpartum Discharge Summary     Patient Name: Elizabeth Taylor DOB: 1985-12-01 MRN: [id number]  Date of admission: 03/06/2020 Delivery date:03/06/2020  Delivering provider: Bernerd Limbo CNM Date of discharge: 03/08/2020  Admitting diagnosis: active labor at 41/0 weeks. Likely GHTN  Additional problems: No PNC, hx of drug abuse    Discharge diagnosis: Term Pregnancy Delivered. Severe pre-eclampsia (BP)                                              Post partum procedures:None Augmentation: N/A Complications: None  Hospital course: Onset of Labor With Vaginal Delivery      34 y.o. yo [id number] at [redacted]w[redacted]d was admitted in Active Labor on 03/06/2020. Patient had an uncomplicated labor course as follows:  Membrane Rupture Time/Date: 3:27 PM ,03/06/2020   Delivery Method:Vaginal, Spontaneous  Episiotomy: None  Lacerations:  None  Patient had an uncomplicated postpartum course.  She received 24 hours of PP Mg. On day of discharge, she was having some asymptomatic mild range BPs so patient was started on norvasc 5mg  po qday.   Patient seen by SW. No barriers to care and plan for d/c baby home with mom.   She is ambulating, tolerating a regular diet, passing flatus, and urinating well. Patient is discharged home in stable condition on 03/08/20.  Newborn Data: Birth date:03/06/2020  Birth time:3:37 PM  Gender:Female  Living status:Living  Apgars:1 ,6  Weight:3090 g   Magnesium Sulfate received: No BMZ received: No Rhophylac:N/A  Physical exam  Vitals:   03/08/20 0300 03/08/20 0936 03/08/20 0937 03/08/20 0952  BP: (!) 146/99  (!) 151/102 (!) 144/95  Pulse: 98  (!) 105 (!) 108  Resp: 18  18   Temp: 97.9 F (36.6 C) 98.2 F (36.8 C)    TempSrc: Oral Oral    SpO2: 100% 100%    Weight:      Height:       General: alert Lochia: appropriate Uterine Fundus: firm, nttp Incision: Healing well with no significant drainage DVT Evaluation: No evidence of DVT seen on physical  exam. Labs: Lab Results  Component Value Date   WBC 9.9 03/07/2020   HGB 9.3 (L) 03/07/2020   HCT 27.1 (L) 03/07/2020   MCV 85.8 03/07/2020   PLT 192 03/07/2020   CMP Latest Ref Rng & Units 03/06/2020  Glucose 70 - 99 mg/dL 03/08/2020)  BUN 6 - 20 mg/dL 7  Creatinine 58(K - 9.98 mg/dL 3.38  Sodium 2.50 - 539 mmol/L 136  Potassium 3.5 - 5.1 mmol/L 4.2  Chloride 98 - 111 mmol/L 105  CO2 22 - 32 mmol/L 20(L)  Calcium 8.9 - 10.3 mg/dL 8.0(L)  Total Protein 6.5 - 8.1 g/dL 5.1(L)  Total Bilirubin 0.3 - 1.2 mg/dL 0.3  Alkaline Phos 38 - 126 U/L 172(H)  AST 15 - 41 U/L 19  ALT 0 - 44 U/L 10   Edinburgh Score: No flowsheet data found.   After visit meds:  Allergies as of 03/08/2020      Reactions   Naproxen Hives   Aleve. Pt states she can tolerate Ibuprofen      Medication List    STOP taking these medications   cephALEXin 500 MG capsule Commonly known as: KEFLEX     TAKE these medications   acetaminophen 325 MG tablet Commonly known as: TYLENOL Take 2 tablets (650 mg total)  by mouth every 4 (four) hours as needed (for pain scale < 4ORtemperature>/=100.5 F).   ALPRAZolam 1 MG tablet Commonly known as: XANAX Take 1 mg by mouth as needed for anxiety.   amLODipine 5 MG tablet Commonly known as: NORVASC Take 1 tablet (5 mg total) by mouth daily. Start taking on: March 09, 2020   amphetamine-dextroamphetamine 20 MG 24 hr capsule Commonly known as: ADDERALL XR Take 20 mg by mouth as needed.   diphenhydrAMINE 25 MG tablet Commonly known as: BENADRYL Take 25 mg by mouth every 6 (six) hours as needed for allergies.   ibuprofen 600 MG tablet Commonly known as: ADVIL Take 1 tablet (600 mg total) by mouth every 6 (six) hours as needed.   prenatal multivitamin Tabs tablet Take 1 tablet by mouth daily at 12 noon.        Discharge home in stable condition Infant Feeding: Bottle Infant Disposition:NICU Discharge instruction: per After Visit Summary and  Postpartum booklet. Activity: Advance as tolerated. Pelvic rest for 6 weeks.  Diet: routine diet Future Appointments: Future Appointments  Date Time Provider Department Center  03/24/2020  3:15 PM [facility]  04/03/2020 10:45 AM Levie Heritage, DO [facility] None   Follow up Visit:   Please schedule this patient for a In person postpartum visit in 4 weeks with the following provider: MD. Additional Postpartum F/U:1wk bp check  High risk pregnancy complicated by: No PNC and hx of drug abuse Delivery mode:  Vaginal, Spontaneous  Anticipated Birth Control: patient received depo provera prior to discharge   Cornelia Copa MD Attending [facility] [employer] (Faculty Practice) 03/08/2020 Time: 1107am

## 2020-03-06 NOTE — H&P (Addendum)
LABOR AND DELIVERY ADMISSION HISTORY AND PHYSICAL  Elizabeth Taylor is a 34 y.o. female [phone] with IUP at [redacted]w[redacted]d presenting for contractions. She reports +FMs, No LOF, no VB, no blurry vision, headaches or peripheral edema, and RUQ pain. She plans on formula feeding. She request interval BTL for birth control. She received no prenatal care. States at around 20 weeks she had an ultrasound done at the pregnancy care network with gave her a due date of 9/9.   Dating: By LMP --->  Estimated Date of Delivery: 02/28/20  Sono:    @[redacted]w[redacted]d , CWD, normal anatomy, cepahlic presentation   Prenatal History/Complications:  - No prenatal care - Polysubstance abuse  - opioid use disorder, was on 60mg  methadone prior to pregnancy  - UDS on 7/15 positive for benzos, amphetamines, and THC -Hep C antibody positive 01/03/2020, quantitative not detected -Hx of preterm labor with first child, @33wk  -Hx of Chlamydia in 2018  Past Medical History: Past Medical History:  Diagnosis Date   Anxiety    Bipolar 1 disorder (HCC)    Chlamydia 2018   Hepatitis C 2018    Past Surgical History: Past Surgical History:  Procedure Laterality Date   MASTECTOMY     WISDOM TOOTH EXTRACTION      Obstetrical History: OB History     Gravida  6   Para  4   Term  3   Preterm  1   AB  2   Living  4      SAB  2   TAB      Ectopic      Multiple  0   Live Births  4        Obstetric Comments  Does not have custody of her children         Social History Social History   Socioeconomic History   Marital status: Single    Spouse name: Not on file   Number of children: Not on file   Years of education: Not on file   Highest education level: Not on file  Occupational History   Not on file  Tobacco Use   Smoking status: Current Some Day Smoker   Smokeless tobacco: Never Used  Vaping Use   Vaping Use: Never used  Substance and Sexual Activity   Alcohol use: No   Drug use: Yes    Types:  Marijuana    Comment: NO DRUGS DURING  PREG   Sexual activity: Not on file  Other Topics Concern   Not on file  Social History Narrative   Not on file   Social Determinants of Health   Financial Resource Strain:    Difficulty of Paying Living Expenses: Not on file  Food Insecurity:    Worried About Running Out of Food in the Last Year: Not on file   Ran Out of Food in the Last Year: Not on file  Transportation Needs:    Lack of Transportation (Medical): Not on file   Lack of Transportation (Non-Medical): Not on file  Physical Activity:    Days of Exercise per Week: Not on file   Minutes of Exercise per Session: Not on file  Stress:    Feeling of Stress : Not on file  Social Connections:    Frequency of Communication with Friends and Family: Not on file   Frequency of Social Gatherings with Friends and Family: Not on file   Attends Religious Services: Not on file   Active Member of Clubs or  Organizations: Not on file   Attends Banker Meetings: Not on file   Marital Status: Not on file    Family History: No family history on file.  Allergies: Allergies  Allergen Reactions   Naproxen Hives    Aleve. Pt states she can tolerate Ibuprofen    Medications Prior to Admission  Medication Sig Dispense Refill Last Dose   ALPRAZolam (XANAX) 1 MG tablet Take 1 mg by mouth as needed for anxiety.   Past Month at Unknown time   Prenatal Vit-Fe Fumarate-FA (PRENATAL MULTIVITAMIN) TABS tablet Take 1 tablet by mouth daily at 12 noon.   03/06/2020 at Unknown time   amphetamine-dextroamphetamine (ADDERALL XR) 20 MG 24 hr capsule Take 20 mg by mouth as needed.   More than a month at Unknown time   cephALEXin (KEFLEX) 500 MG capsule Take 1 capsule (500 mg total) by mouth 4 (four) times daily. 21 capsule 0    diphenhydrAMINE (BENADRYL) 25 MG tablet Take 25 mg by mouth every 6 (six) hours as needed for allergies.        Review of Systems   All systems reviewed and negative  except as stated in HPI  Blood pressure (!) 144/95, pulse (!) 102, temperature 98.9 F (37.2 C), resp. rate 16, height 5\' 4"  (1.626 m), weight 160 lb (72.6 kg), SpO2 100 %, unknown if currently breastfeeding. General appearance: alert and appears stated age Lungs: clear to auscultation bilaterally Heart: regular rate and rhythm Abdomen: soft, non-tender Pelvic: 8cm dilated, station 0 Presentation: cephalic Fetal monitoringBaseline: 130 bpm, Variability: Good {> 6 bpm), Accelerations: Reactive and Decelerations: Absent Uterine activity: Started ~5AM, Frequency: Every 1-3 minutes and Duration: 30 seconds Dilation: 10 Effacement (%): 100 Exam by:: beard  Prenatal labs: ABO, Rh: --/--/O POS (09/16 1400) Antibody: NEG (09/16 1400) Rubella: 1.26 (07/15 1437) RPR: NON REACTIVE (07/15 1437)  HBsAg: NON REACTIVE (07/15 1437)  HIV: Non Reactive (07/15 1437)  GBS: NEGATIVE/-- (09/16 1358) unknown 1 hr Glucola not done, glucose of 86 on 7/15 Genetic screening: none Anatomy 8/15 normal  Prenatal Transfer Tool  Maternal Diabetes: No Genetic Screening: None (no prenatal care) Maternal Ultrasounds/Referrals: Normal Fetal Ultrasounds or other Referrals:  None Maternal Substance Abuse:  Yes:  Type: Marijuana, Prescription drugs, Other:  UDS on 7/15 positive for benzos, amphetamines, and marijuana, also admitted to cocaine use at this visit and had requested suboxone therapy Significant Maternal Medications:  None Significant Maternal Lab Results: Other: HCV Ab Reactive  Results for orders placed or performed during the hospital encounter of 03/06/20 (from the past 24 hour(s))  Group B strep by PCR   Collection Time: 03/06/20  1:58 PM   Specimen: Vaginal/Rectal; Genital  Result Value Ref Range   Group B strep by PCR NEGATIVE NEGATIVE  SARS Coronavirus 2 by RT PCR (hospital order, performed in [facility] hospital lab) Nasopharyngeal Nasopharyngeal Swab   Collection Time: 03/06/20  1:58 PM    Specimen: Nasopharyngeal Swab  Result Value Ref Range   SARS Coronavirus 2 NEGATIVE NEGATIVE  Type and screen [facility]   Collection Time: 03/06/20  2:00 PM  Result Value Ref Range   ABO/RH(D) O POS    Antibody Screen NEG    Sample Expiration      03/09/2020,2359 Performed at [facility], [address], [place], [place]   CBC   Collection Time: 03/06/20  3:00 PM  Result Value Ref Range   WBC 13.8 (H) 4.0 - 10.5  K/uL   RBC 3.41 (L) 3.87 - 5.11 MIL/uL   Hemoglobin 10.0 (L) 12.0 - 15.0 g/dL   HCT 43.3 (L) 36 - 46 %   MCV 85.9 80.0 - 100.0 fL   MCH 29.3 26.0 - 34.0 pg   MCHC 34.1 30.0 - 36.0 g/dL   RDW 29.5 18.8 - 41.6 %   Platelets 192 150 - 400 K/uL   nRBC 0.0 0.0 - 0.2 %  Comprehensive metabolic panel   Collection Time: 03/06/20  3:00 PM  Result Value Ref Range   Sodium 136 135 - 145 mmol/L   Potassium 4.2 3.5 - 5.1 mmol/L   Chloride 105 98 - 111 mmol/L   CO2 20 (L) 22 - 32 mmol/L   Glucose, Bld 68 (L) 70 - 99 mg/dL   BUN 7 6 - 20 mg/dL   Creatinine, Ser 6.06 0.44 - 1.00 mg/dL   Calcium 8.0 (L) 8.9 - 10.3 mg/dL   Total Protein 5.1 (L) 6.5 - 8.1 g/dL   Albumin 2.2 (L) 3.5 - 5.0 g/dL   AST 19 15 - 41 U/L   ALT 10 0 - 44 U/L   Alkaline Phosphatase 172 (H) 38 - 126 U/L   Total Bilirubin 0.3 0.3 - 1.2 mg/dL   GFR calc non Af Amer >60 >60 mL/min   GFR calc Af Amer >60 >60 mL/min   Anion gap 11 5 - 15  Protein / creatinine ratio, urine   Collection Time: 03/06/20  4:55 PM  Result Value Ref Range   Creatinine, Urine 59.08 mg/dL   Total Protein, Urine 8 mg/dL   Protein Creatinine Ratio 0.14 0.00 - 0.15 mg/mg[Cre]  Urine rapid drug screen (hosp performed)   Collection Time: 03/06/20  4:55 PM  Result Value Ref Range   Opiates POSITIVE (A) NONE DETECTED   Cocaine NONE DETECTED NONE DETECTED   Benzodiazepines POSITIVE (A) NONE DETECTED   Amphetamines NONE DETECTED NONE DETECTED   Tetrahydrocannabinol NONE DETECTED NONE DETECTED    Barbiturates NONE DETECTED NONE DETECTED    Patient Active Problem List   Diagnosis Date Noted   Indication for care in labor and delivery, antepartum 03/06/2020   No prenatal care in current pregnancy in third trimester 01/04/2020   Drug use affecting pregnancy in third trimester 01/04/2020   Hepatitis C antibody positive in blood 03/17/2017    Assessment/Plan:  Ramatoulaye N Nydam is a 34 y.o. [id number] at [redacted]w[redacted]d here with contractions.  #Labor: progressing quickly, expectant management #Pain: wants epidural, waiting on CBC #FWB: Category 1 #ID: GBS unknown #MOF: formula #MOC: interval BTL #Circ: N/A (girl) # f/u CMP, CBC, RPR, UDS, type and screen, COVID, GBS, urine protein/creatinine ratio  Dagoberto Ligas, MS-3 [employer] of Medicine  Attestation of Supervision of Student:  I confirm that I have verified the information documented in the medical student's note and that I have also personally performed the history, physical exam and all medical decision making activities.  I have verified that all services and findings are accurately documented in this student's note; and I agree with management and plan as outlined in the documentation. I have also made any necessary editorial changes.  Bernerd Limbo, CNM Center for [employer], [facility] 03/06/2020 6:11 PM

## 2020-03-06 NOTE — Progress Notes (Signed)
Patient ID: Elizabeth Taylor, female   DOB: 1986-06-10, 34 y.o.   MRN: [id number]  Pt having persistent elevated blood pressures, one has been severe range. Pt denies HA, blurred vision, or epigastric pain. She is half-asleep in the bed and shows possible s/sx of current drug use (pinpoint pupils, dry mouth, and sleepiness). Her UDS is not resulted yet. Consulted with Dr. Macon Large who agreed to keep [name] on L&D for an hour of observation. If she has another severe range pressure, we will start magnesium titration. If it remains persistently high without severe features, we will begin Procardia XL. RN aware.  Vitals:   03/06/20 1646 03/06/20 1701  BP: (!) 159/100 (!) 155/115  Pulse: 77 74  Resp:  16  Temp:      Edd Arbour, CNM, MSN, [facility] 03/06/20 5:08 PM

## 2020-03-06 NOTE — Progress Notes (Signed)
This chaplain phoned Lorinda after page alerting chaplain of missed Code.  The chaplain understands from Lorinda the Pt. has no desire to see a chaplain at this time. Lorinda appreciated the F/U.

## 2020-03-07 ENCOUNTER — Other Ambulatory Visit: Payer: Self-pay

## 2020-03-07 LAB — CBC
HCT: 27.1 % — ABNORMAL LOW (ref 36.0–46.0)
Hemoglobin: 9.3 g/dL — ABNORMAL LOW (ref 12.0–15.0)
MCH: 29.4 pg (ref 26.0–34.0)
MCHC: 34.3 g/dL (ref 30.0–36.0)
MCV: 85.8 fL (ref 80.0–100.0)
Platelets: 192 10*3/uL (ref 150–400)
RBC: 3.16 MIL/uL — ABNORMAL LOW (ref 3.87–5.11)
RDW: 14 % (ref 11.5–15.5)
WBC: 9.9 10*3/uL (ref 4.0–10.5)
nRBC: 0 % (ref 0.0–0.2)

## 2020-03-07 MED ORDER — ZOLPIDEM TARTRATE 5 MG PO TABS: 5.0000 mg | ORAL_TABLET | Freq: Every evening | ORAL | Status: DC | PRN

## 2020-03-07 NOTE — Progress Notes (Signed)
Post Partum Day 1 Subjective: no complaints  Objective: Blood pressure (!) 131/91, pulse (!) 108, temperature 98.1 F (36.7 C), temperature source Oral, resp. rate 18, height 5\' 4"  (1.626 m), weight 72.6 kg, SpO2 99 %, unknown if currently breastfeeding.  Physical Exam:  General: alert, cooperative and no distress Lochia: appropriate Uterine Fundus: firm DVT Evaluation: No evidence of DVT seen on physical exam.  Recent Labs    03/06/20 1500 03/07/20 0533  HGB 10.0* 9.3*  HCT 29.3* 27.1*    Assessment/Plan: Patient Active Problem List   Diagnosis Date Noted   Indication for care in labor and delivery, antepartum 03/06/2020   No prenatal care in current pregnancy in third trimester 01/04/2020   Drug use affecting pregnancy in third trimester 01/04/2020   Hepatitis C antibody positive in blood 03/17/2017    Social Work consult   LOS: 1 day   03/19/2017 03/07/2020, 10:21 AM

## 2020-03-07 NOTE — Progress Notes (Signed)
RN to patients room to round and assess. RN found patient lying in bed resting. Pt alert and oriented when speaking with patient. Upon doing fundal assessment, noticed pad full and needed to be changed. Helped patient to change pad, gown and bed pad. Upon removing bed pad, found small ziplock bag with white residue. Also small turquoise plastic piece fell on floor. Pt immediately grabbed and pushed under her. Pt did not offer any explanations. MD notified at 08:30am of findings. Social worker also notified at 10:00am. Continue to monitor.

## 2020-03-07 NOTE — Progress Notes (Signed)
CSW completed chart review and will complete psychosocial assessment once [name]'s magnesium is discontinued.   CSW made a [place] DSS CPS report due to infant's positive UDS for opiates (rx?) and benzodiazepines (rx?), concerns about [name]'s current substance use and [name] not having custody of other children per chart.   Currently there are barriers to infant discharging home to [name]. CSW awaiting return call from [place] CPS.   Paelyn Smick, LCSW Clinical Social Worker [facility] Cell#: [phone] 

## 2020-03-08 LAB — RPR: RPR Ser Ql: NONREACTIVE

## 2020-03-08 MED ORDER — AMLODIPINE BESYLATE 5 MG PO TABS: 5.0000 mg | ORAL_TABLET | Freq: Every day | ORAL | 0 refills | Status: AC

## 2020-03-08 MED ORDER — IBUPROFEN 600 MG PO TABS: 600.0000 mg | ORAL_TABLET | Freq: Four times a day (QID) | ORAL | 0 refills | Status: AC | PRN

## 2020-03-08 MED ORDER — AMLODIPINE BESYLATE 5 MG PO TABS
5.0000 mg | ORAL_TABLET | Freq: Every day | ORAL | Status: DC
  Administered 2020-03-08: 5 mg via ORAL
  Filled 2020-03-08: qty 1

## 2020-03-08 MED ORDER — ACETAMINOPHEN 325 MG PO TABS: 650.0000 mg | ORAL_TABLET | ORAL | Status: AC | PRN

## 2020-03-08 MED ORDER — MEDROXYPROGESTERONE ACETATE 150 MG/ML IM SUSP
150.0000 mg | Freq: Once | INTRAMUSCULAR | Status: AC
  Administered 2020-03-08: 150 mg via INTRAMUSCULAR
  Filled 2020-03-08: qty 1
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
## 2020-03-08 NOTE — Progress Notes (Signed)
OB Note   Patient not in room, but belongings are still there. I went to the NICU and patient not in baby's room. I asked her OB nurse to let me know when she is in the room so I can officially see. I started her on low dose norvasc 5mg  qday given slight mild range BP. Pt should still be fine for d/c to home today.  MD Attending [facility] (Faculty Practice) 03/08/2020 Time: [phone]

## 2020-03-08 NOTE — Progress Notes (Addendum)
Reviewed discharge instructions with patient regarding medications, follow up appointments, when to call MD/go to MAU. Patient verbalized understanding of instructions at 10:50am.   Pt left ambulatory in stable condition at 1500.

## 2020-03-11 ENCOUNTER — Encounter: Payer: Self-pay | Admitting: Family Medicine

## 2020-03-20 NOTE — BH Specialist Note (Signed)
Pt did not arrive to video visit and did not answer the phone ; Left HIPPA-compliant message to call back Asher Muir from [facility] for [employer] at [facility] at [phone] (main office) or [phone] (Artha Chiasson's office).  ; pt does not have MyChart set up so no message left.

## 2020-03-24 ENCOUNTER — Ambulatory Visit: Payer: Medicaid Other | Admitting: Clinical

## 2020-03-24 DIAGNOSIS — Z91199 Patient's noncompliance with other medical treatment and regimen due to unspecified reason: Secondary | ICD-10-CM

## 2020-04-03 ENCOUNTER — Ambulatory Visit: Payer: Medicaid Other | Admitting: Family Medicine

## 2022-01-17 IMAGING — US US MFM FETAL BPP W/O NON-STRESS
1 series · 14 of 28 positions shown · non-contrast
Comparison: none

[Series 1: us mfm fetal bpp w/o non-stress · 14 of 78 slices shown]
[im 3/78]
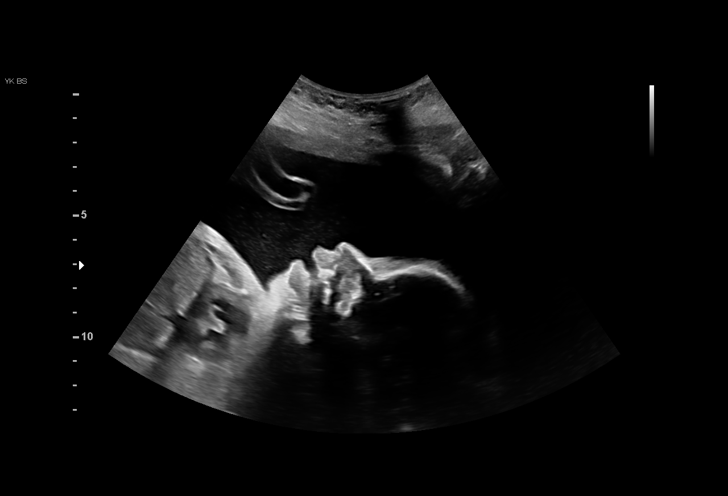
[im 9/78]
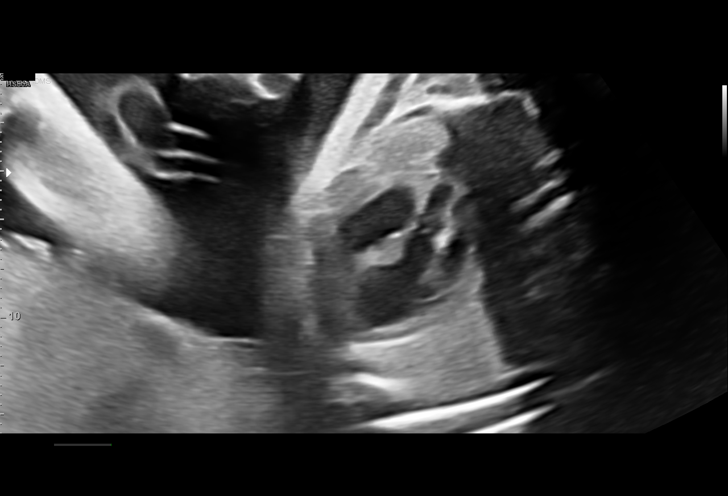
[im 15/78]
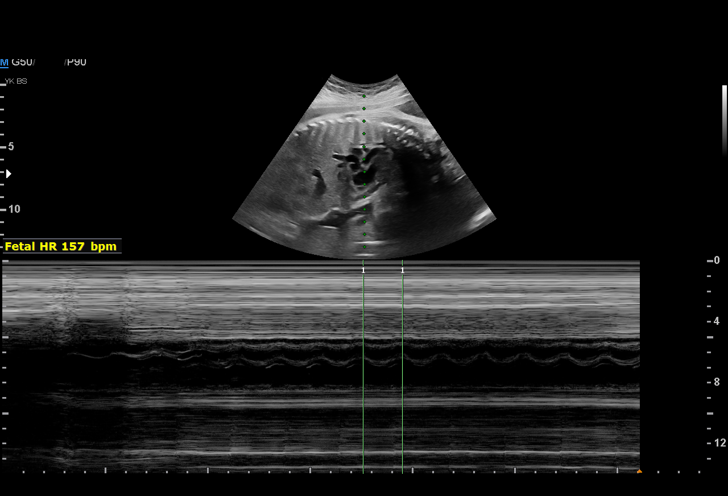
[im 20/78]
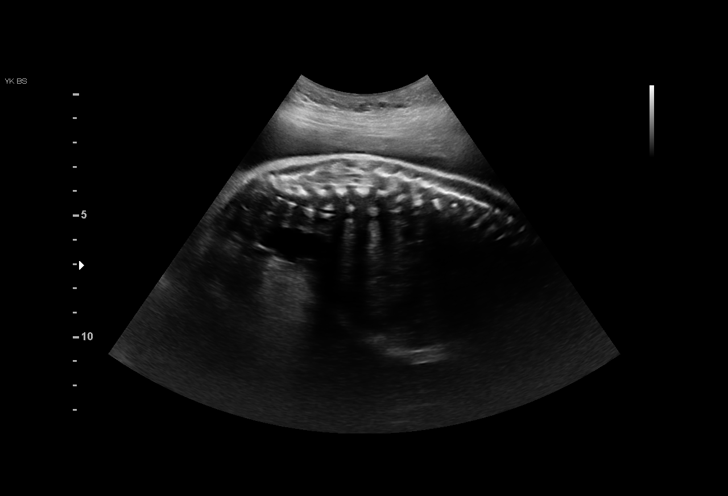
[im 26/78]
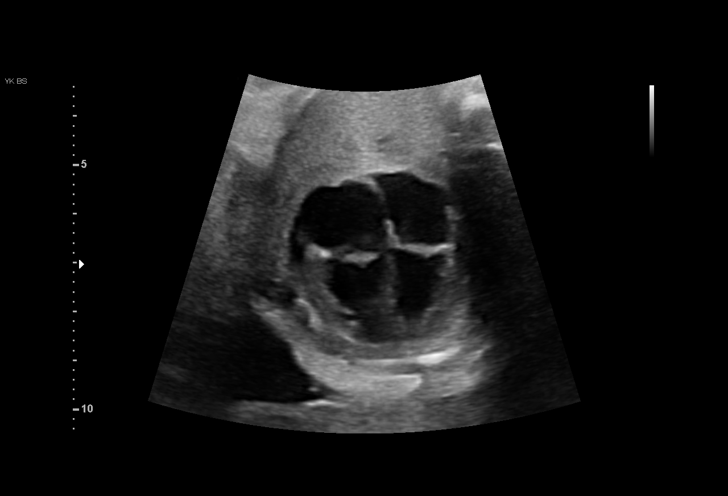
[im 32/78]
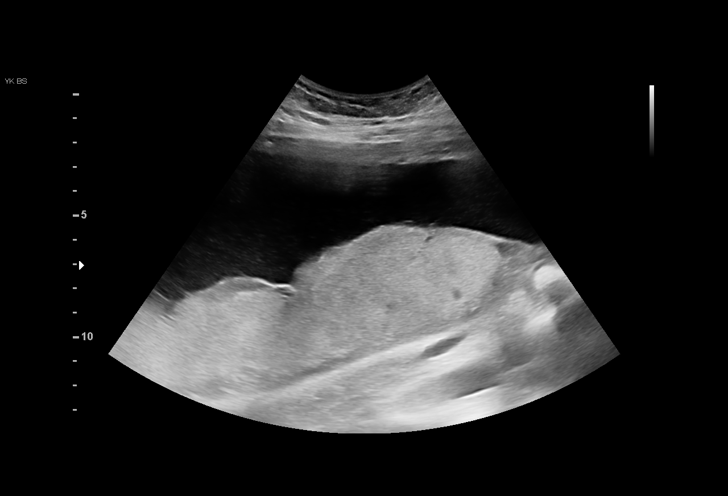
[im 38/78]
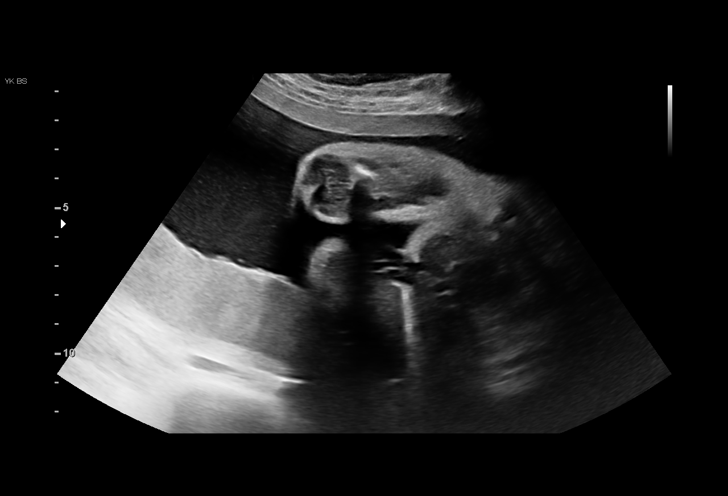
[im 43/78]
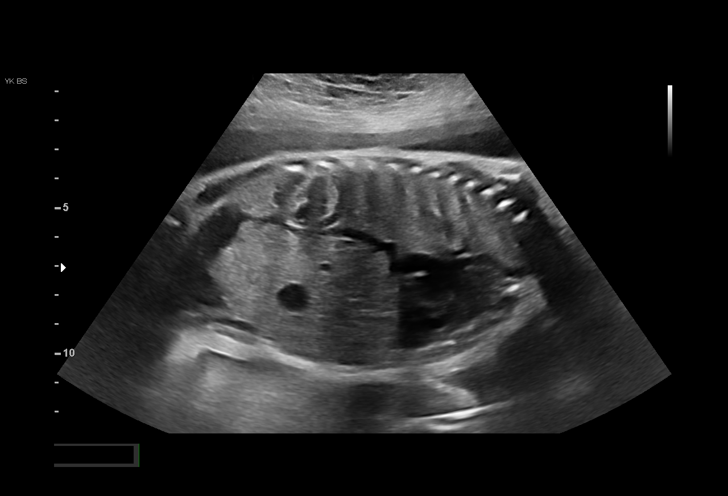
[im 49/78]
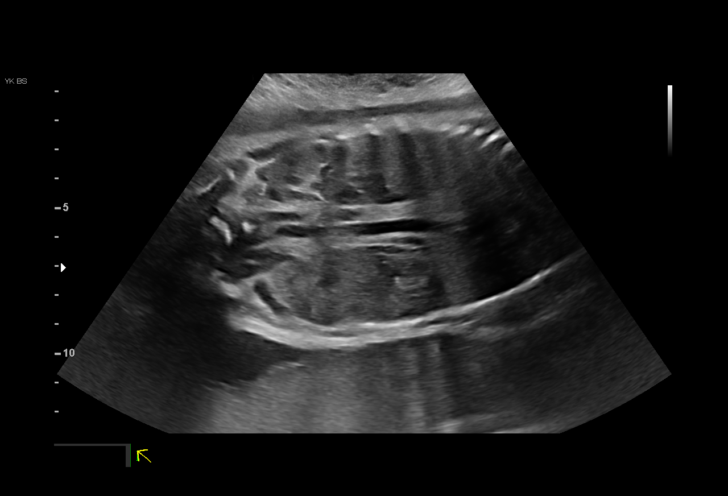
[im 55/78]
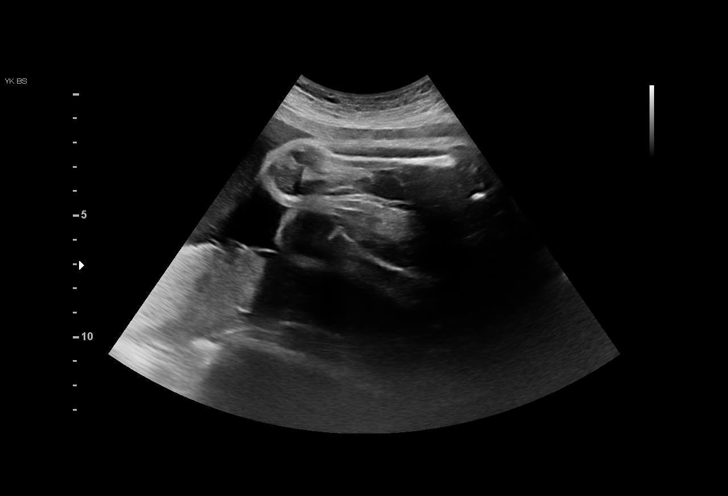
[im 60/78]
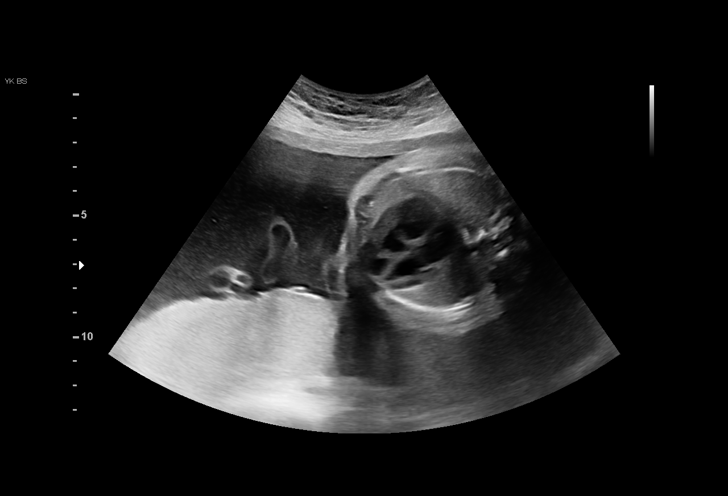
[im 66/78]
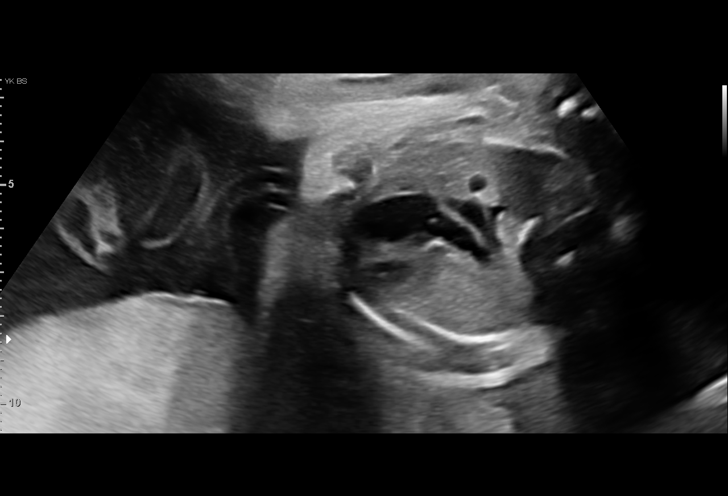
[im 72/78]
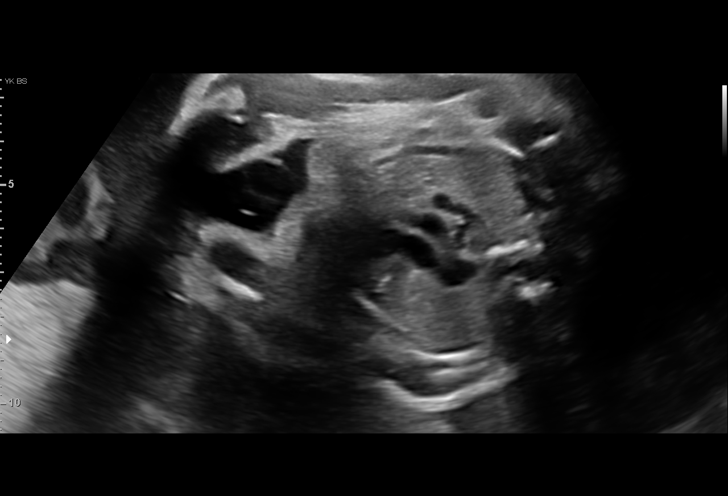
[im 78/78]
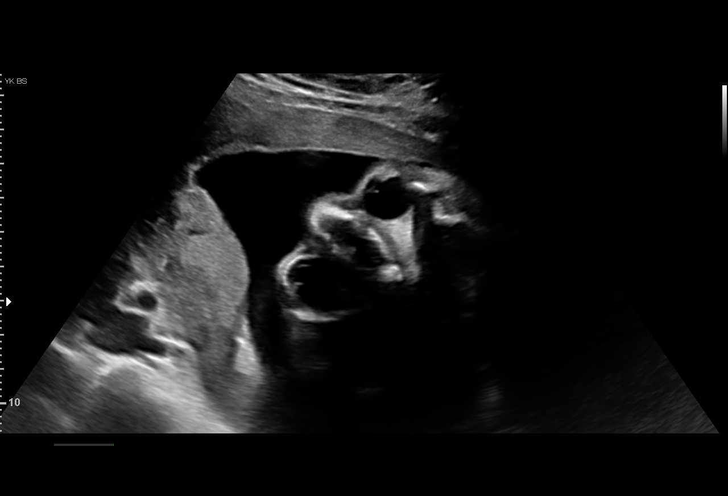

[14 of 28 positions shown; findings below may reference images not displayed]

Referred By:      [facility]/Triage         [facility]

Indications

 Insufficient Prenatal Care
 Non-reactive NST
 Abdominal pain in pregnancy
 Late to prenatal care, third trimester
 32 weeks gestation of pregnancy
Fetal Evaluation

 Num Of Fetuses:         1
 Fetal Heart Rate(bpm):  157
 Cardiac Activity:       Observed
 Presentation:           Cephalic
 Placenta:               Posterior
 P. Cord Insertion:      Not well visualized

 Amniotic Fluid
 AFI FV:      Subjectively upper-normal

 AFI Sum(cm)     %Tile       Largest Pocket(cm)
 23.8            93          7

 RUQ(cm)       RLQ(cm)       LUQ(cm)        LLQ(cm)
 5.7           5.4           7
Biophysical Evaluation

 Amniotic F.V:   Within normal limits       F. Tone:        Observed
 F. Movement:    Observed                   N.S.T:          Nonreactive
 F. Breathing:   Not Observed               Score:          [DATE]
OB History
 Gravidity:    6         Term:   2        Prem:   1        SAB:   2
 Living:       3
Gestational Age

 LMP:           32w 0d        Date:  05/24/19                 EDD:   02/28/20
 Best:          32w 0d     Det. By:  LMP  (05/24/19)          EDD:   02/28/20
Anatomy

 Cranium:               Appears normal         Abdomen:                Appears normal
 Ventricles:            Appears normal         Abdominal Wall:         Appears nml (cord
                                                                       insert, abd wall)
 Face:                  Appears normal         Cord Vessels:           Appears normal (3
                        (orbits and profile)                           vessel cord)
 Lips:                  Appears normal         Kidneys:                Appear normal
 Heart:                 Appears normal         Bladder:                Appears normal
                        (4CH, axis, and
                        situs)
 LVOT:                  Appears normal         Spine:                  Appears normal
 Diaphragm:             Appears normal         Upper Extremities:      Visualized
 Stomach:               Appears normal, left   Lower Extremities:      Visualized
                        sided
Impression

 Antenatal testing for due to non-reactive NST
 Biophysical profile [DATE] with good fetal movement and
 amniotic fluid volume.
Recommendations

 Consider repeat biophysical profile in 24 hours, given
 prematurity.
